# Patient Record
Sex: Female | Born: 1965
Health system: Southern US, Community
[De-identification: ages and names within clinical notes are randomized; demographics above are authoritative.]

## PROBLEM LIST (undated history)

## (undated) DIAGNOSIS — E559 Vitamin D deficiency, unspecified: Secondary | ICD-10-CM

## (undated) DIAGNOSIS — E669 Obesity, unspecified: Secondary | ICD-10-CM

## (undated) DIAGNOSIS — K449 Diaphragmatic hernia without obstruction or gangrene: Secondary | ICD-10-CM

## (undated) DIAGNOSIS — R112 Nausea with vomiting, unspecified: Secondary | ICD-10-CM

## (undated) DIAGNOSIS — Z9889 Other specified postprocedural states: Secondary | ICD-10-CM

## (undated) DIAGNOSIS — M79606 Pain in leg, unspecified: Secondary | ICD-10-CM

## (undated) DIAGNOSIS — F1010 Alcohol abuse, uncomplicated: Secondary | ICD-10-CM

## (undated) DIAGNOSIS — F410 Panic disorder [episodic paroxysmal anxiety] without agoraphobia: Secondary | ICD-10-CM

## (undated) DIAGNOSIS — M069 Rheumatoid arthritis, unspecified: Secondary | ICD-10-CM

## (undated) DIAGNOSIS — E538 Deficiency of other specified B group vitamins: Secondary | ICD-10-CM

## (undated) DIAGNOSIS — G4730 Sleep apnea, unspecified: Secondary | ICD-10-CM

## (undated) DIAGNOSIS — I8390 Asymptomatic varicose veins of unspecified lower extremity: Secondary | ICD-10-CM

## (undated) DIAGNOSIS — F32A Depression, unspecified: Secondary | ICD-10-CM

## (undated) DIAGNOSIS — K219 Gastro-esophageal reflux disease without esophagitis: Secondary | ICD-10-CM

## (undated) HISTORY — PX: ENDOMETRIAL ABLATION W/ NOVASURE: SUR434

## (undated) HISTORY — PX: TUBAL LIGATION: SHX77

## (undated) HISTORY — DX: Deficiency of other specified B group vitamins: E53.8

## (undated) HISTORY — DX: Other specified postprocedural states: Z98.890

## (undated) HISTORY — DX: Diaphragmatic hernia without obstruction or gangrene: K44.9

## (undated) HISTORY — DX: Asymptomatic varicose veins of unspecified lower extremity: I83.90

## (undated) HISTORY — DX: Obesity, unspecified: E66.9

## (undated) HISTORY — DX: Gastro-esophageal reflux disease without esophagitis: K21.9

## (undated) HISTORY — DX: Pain in leg, unspecified: M79.606

## (undated) HISTORY — DX: Vitamin D deficiency, unspecified: E55.9

## (undated) HISTORY — DX: Alcohol abuse, uncomplicated: F10.10

## (undated) HISTORY — DX: Depression, unspecified: F32.A

## (undated) HISTORY — DX: Sleep apnea, unspecified: G47.30

## (undated) HISTORY — DX: Rheumatoid arthritis, unspecified: M06.9

## (undated) HISTORY — DX: Panic disorder (episodic paroxysmal anxiety): F41.0

## (undated) HISTORY — DX: Nausea with vomiting, unspecified: R11.2

---

## 1997-05-20 ENCOUNTER — Inpatient Hospital Stay (HOSPITAL_COMMUNITY): Admission: AD | Admit: 1997-05-20 | Discharge: 1997-05-22 | Payer: Self-pay | Admitting: *Deleted

## 1999-03-15 ENCOUNTER — Inpatient Hospital Stay (HOSPITAL_COMMUNITY): Admission: AD | Admit: 1999-03-15 | Discharge: 1999-03-17 | Payer: Self-pay | Admitting: Obstetrics and Gynecology

## 2001-09-23 ENCOUNTER — Other Ambulatory Visit: Admission: RE | Admit: 2001-09-23 | Discharge: 2001-09-23 | Payer: Self-pay | Admitting: Obstetrics and Gynecology

## 2002-11-09 ENCOUNTER — Other Ambulatory Visit: Admission: RE | Admit: 2002-11-09 | Discharge: 2002-11-09 | Payer: Self-pay | Admitting: Obstetrics and Gynecology

## 2003-06-02 ENCOUNTER — Inpatient Hospital Stay (HOSPITAL_COMMUNITY): Admission: AD | Admit: 2003-06-02 | Discharge: 2003-06-05 | Payer: Self-pay | Admitting: Obstetrics and Gynecology

## 2003-07-15 ENCOUNTER — Other Ambulatory Visit: Admission: RE | Admit: 2003-07-15 | Discharge: 2003-07-15 | Payer: Self-pay | Admitting: Obstetrics and Gynecology

## 2006-02-18 ENCOUNTER — Ambulatory Visit (HOSPITAL_COMMUNITY): Admission: RE | Admit: 2006-02-18 | Discharge: 2006-02-18 | Payer: Self-pay | Admitting: Obstetrics and Gynecology

## 2007-04-15 ENCOUNTER — Ambulatory Visit (HOSPITAL_COMMUNITY): Admission: RE | Admit: 2007-04-15 | Discharge: 2007-04-15 | Payer: Self-pay | Admitting: Obstetrics and Gynecology

## 2007-07-31 ENCOUNTER — Ambulatory Visit (HOSPITAL_COMMUNITY): Admission: RE | Admit: 2007-07-31 | Discharge: 2007-07-31 | Payer: Self-pay | Admitting: *Deleted

## 2007-10-26 ENCOUNTER — Ambulatory Visit (HOSPITAL_COMMUNITY): Admission: RE | Admit: 2007-10-26 | Discharge: 2007-10-26 | Payer: Self-pay | Admitting: *Deleted

## 2008-04-19 ENCOUNTER — Ambulatory Visit (HOSPITAL_COMMUNITY): Admission: RE | Admit: 2008-04-19 | Discharge: 2008-04-19 | Payer: Self-pay | Admitting: Obstetrics and Gynecology

## 2008-07-27 ENCOUNTER — Ambulatory Visit: Payer: Self-pay | Admitting: Vascular Surgery

## 2009-01-27 ENCOUNTER — Ambulatory Visit: Payer: Self-pay | Admitting: Vascular Surgery

## 2009-02-10 ENCOUNTER — Emergency Department (HOSPITAL_BASED_OUTPATIENT_CLINIC_OR_DEPARTMENT_OTHER): Admission: EM | Admit: 2009-02-10 | Discharge: 2009-02-10 | Payer: Self-pay | Admitting: Emergency Medicine

## 2009-02-15 ENCOUNTER — Ambulatory Visit: Payer: Self-pay | Admitting: Vascular Surgery

## 2009-03-15 ENCOUNTER — Ambulatory Visit: Payer: Self-pay | Admitting: Vascular Surgery

## 2009-04-20 ENCOUNTER — Ambulatory Visit (HOSPITAL_COMMUNITY): Admission: RE | Admit: 2009-04-20 | Discharge: 2009-04-20 | Payer: Self-pay | Admitting: Obstetrics and Gynecology

## 2009-06-22 ENCOUNTER — Ambulatory Visit: Payer: Self-pay | Admitting: Internal Medicine

## 2009-07-21 ENCOUNTER — Ambulatory Visit: Payer: Self-pay | Admitting: Internal Medicine

## 2009-09-22 ENCOUNTER — Ambulatory Visit (HOSPITAL_COMMUNITY): Admission: RE | Admit: 2009-09-22 | Discharge: 2009-09-22 | Payer: Self-pay | Admitting: Obstetrics & Gynecology

## 2010-03-16 ENCOUNTER — Ambulatory Visit
Admission: RE | Admit: 2010-03-16 | Discharge: 2010-03-16 | Payer: Self-pay | Source: Home / Self Care | Attending: Internal Medicine | Admitting: Internal Medicine

## 2010-03-29 ENCOUNTER — Other Ambulatory Visit (HOSPITAL_COMMUNITY): Payer: Self-pay | Admitting: Obstetrics and Gynecology

## 2010-03-29 DIAGNOSIS — Z1231 Encounter for screening mammogram for malignant neoplasm of breast: Secondary | ICD-10-CM

## 2010-03-29 DIAGNOSIS — Z1239 Encounter for other screening for malignant neoplasm of breast: Secondary | ICD-10-CM

## 2010-04-23 ENCOUNTER — Ambulatory Visit (HOSPITAL_COMMUNITY)
Admission: RE | Admit: 2010-04-23 | Discharge: 2010-04-23 | Disposition: A | Discharge status: Home / Self Care | Payer: 59 | Source: Ambulatory Visit | Attending: Obstetrics and Gynecology | Admitting: Obstetrics and Gynecology

## 2010-04-23 ENCOUNTER — Ambulatory Visit (HOSPITAL_COMMUNITY): Admission: RE | Admit: 2010-04-23 | Payer: Self-pay | Source: Home / Self Care | Admitting: Obstetrics and Gynecology

## 2010-04-23 DIAGNOSIS — Z1231 Encounter for screening mammogram for malignant neoplasm of breast: Secondary | ICD-10-CM

## 2010-05-26 LAB — GLUCOSE, CAPILLARY
Glucose-Capillary: 63 mg/dL — ABNORMAL LOW (ref 70–99)
Glucose-Capillary: 74 mg/dL (ref 70–99)

## 2010-05-27 LAB — BASIC METABOLIC PANEL
BUN: 7 mg/dL (ref 6–23)
Chloride: 106 mEq/L (ref 96–112)
Creatinine, Ser: 0.83 mg/dL (ref 0.4–1.2)
GFR calc non Af Amer: >60 mL/min (ref >60)

## 2010-05-27 LAB — CBC
MCV: 89 fL (ref 78.0–100.0)
Platelets: 253 10*3/uL (ref 150–400)
RDW: 15 % (ref 11.5–15.5)
WBC: 5.3 10*3/uL (ref 4.0–10.5)

## 2010-06-12 LAB — URINALYSIS, ROUTINE W REFLEX MICROSCOPIC
Glucose, UA: NEGATIVE mg/dL
Ketones, ur: NEGATIVE mg/dL
Leukocytes, UA: NEGATIVE
Protein, ur: NEGATIVE mg/dL

## 2010-06-12 LAB — WET PREP, GENITAL
Trich, Wet Prep: NONE SEEN
Yeast Wet Prep HPF POC: NONE SEEN

## 2010-06-12 LAB — GC/CHLAMYDIA PROBE AMP, GENITAL
Chlamydia, DNA Probe: NEGATIVE
GC Probe Amp, Genital: NEGATIVE

## 2010-07-03 ENCOUNTER — Ambulatory Visit (INDEPENDENT_AMBULATORY_CARE_PROVIDER_SITE_OTHER): Payer: 59 | Admitting: Internal Medicine

## 2010-07-03 DIAGNOSIS — M25569 Pain in unspecified knee: Secondary | ICD-10-CM

## 2010-07-03 DIAGNOSIS — J069 Acute upper respiratory infection, unspecified: Secondary | ICD-10-CM

## 2010-07-24 NOTE — Consult Note (Signed)
VASCULAR SURGERY CONSULTATION   AAMORI, MCMASTERS  DOB:  [birth year]                                       07/27/2008  WJXBJ#:[id number]   The patient presents today for evaluation of bilateral lower extremity  varicosities.  She reports that she has discomfort over the left lateral  thigh and does not have any pain over the varicosities in the right  thigh.  She does not have any history of deep venous thrombosis,  superficial thrombophlebitis or bleeding.   PAST HISTORY:  Negative for hypertension, cancer, diabetes or  cardiovascular or respiratory disease.  She does have a history of  depression in the past.   SOCIAL HISTORY:  She is single.  She does have 5 children.  She does not  smoke.  Does have 1 or 2 drinks per week.   REVIEW OF SYSTEMS:  Her weight is reported at 318 pounds.  She is 5 feet  4 inches tall.  Her review of systems otherwise negative.   PHYSICAL EXAMINATION:  An obese black female in no acute distress.  Her  dorsalis pedis pulses are 2+ bilaterally.  She does have varicosities in  an unusual location in her lateral thighs bilaterally.  She has a few  scattered varicosities in her right medial calf.   She underwent noninvasive vascular lab studies and this does show this  plexus of varicosities extending from her hip bilaterally down to her  torture knee on the lateral aspect of her thigh, this does not appear to  be communicating with saphenous or deep system.  She does not have any  evidence of reflux in her saphenous vein bilaterally.  I discussed this  at length with the patient.  I explained that typically we would  anticipate communication to the superficial varicosities from a  refluxing vein.  We were not able to demonstrate this.  Since she is  having pain over her left lateral thigh, I have recommended that we  start her in compression garments.  Due to her size, she was given a  prescription for panty-style compression garments.   We will see her in 3  months and determine if this is effectively improving her symptoms.  I  then discussed the option of stab phlebectomy of her varicosities to  hopefully improve her symptoms should the conservative treatment fail.  She will see [place] again in 3 months for continued follow up.   Larina Earthly, M.D.  Electronically Signed  TFE/MEDQ  D:  07/27/2008  T:  07/28/2008  Job:  [id number]

## 2010-07-24 NOTE — Procedures (Signed)
LOWER EXTREMITY VENOUS REFLUX EXAM   INDICATION:  Bilateral lower extremity varicose veins.   EXAM:  Using color-flow imaging and pulse Doppler spectral analysis, the  right and left common femoral, superficial femoral, popliteal, posterior  tibial, greater and lesser saphenous veins are evaluated.  There is no  evidence suggesting deep venous insufficiency in the right or left lower  extremity.   The right and left saphenofemoral junctions are competent.  The right  and left GSVs are mildly incompetent proximally.   The right and left proximal short saphenous veins demonstrates  competency.   GSV Diameter (used if found to be incompetent only)                                            Right    Left  Proximal Greater Saphenous Vein           cm       cm  Proximal-to-mid-thigh                     cm       cm  Mid thigh                                 cm       cm  Mid-distal thigh                          cm       cm  Distal thigh                              cm       cm  Knee                                      cm       cm   IMPRESSION:  1. The right and left greater saphenous veins are identified      bilaterally.  2. The right and left greater saphenous veins are not aneurysmal.  3. The right and left greater saphenous veins are not tortuous.  4. The deep venous system is competent bilaterally.  5. The right and left lesser saphenous veins are competent.  6. Varicose veins originate from unknown tortuous superficial veins      bilaterally which course down the lateral aspect of the thigh from      the hip to the knee.  The left greater saphenous vein is not      involved with varicose veins.  The right greater saphenous vein is      involved at the proximal calf where a communicating vessel spans      from the varicose veins on the lateral aspect to the popliteal      level greater saphenous vein.   ___________________________________________  Larina Earthly, M.D.   MC/MEDQ  D:  07/27/2008  T:  07/27/2008  Job:  [id number]

## 2010-07-24 NOTE — Assessment & Plan Note (Signed)
OFFICE VISIT   SOMMER, SPICKARD  DOB:  1965/06/07                                       01/27/2009  JXBJY#:[id number]   The patient presents today for continued followup of her left leg venous  pathology and pain.  She has continued pain over her lateral thigh and  lateral calf related to tributary varicosities.  She had undergone a  formal venous duplex at her last visit showing no evidence of venous  hypertension related to saphenous vein incompetence.  She did have a  tributaries over both lateral right and left thigh, although she says  that she does not have any pain over the right thigh.  She has worn  compression garments and reports this has not given any symptom relief  for her.  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  She  also reports cooking and household chores are difficult with leg pain  and swelling as well.  I have recommend that we undergo stab phlebectomy  of the tributaries over her left lateral thigh and also sclerotherapy  for the reticular veins in her left calf which are also causing pain.  She wishes to proceed with this as soon as we have assured insurance  coverage   Larina Earthly, M.D.  Electronically Signed   TFE/MEDQ  D:  01/27/2009  T:  01/30/2009  Job:  [id number]

## 2010-07-24 NOTE — Assessment & Plan Note (Signed)
OFFICE VISIT   BERONICA, LANSDALE  DOB:  1965-07-10                                       02/15/2009  EAVWU#:[id number]   The patient presents today for treatment of her painful left leg  tributary varicosities.  She under local anesthesia underwent stab  phlebectomy of multiple tributary varicosities over her lateral thigh  and calf and also underwent sclerotherapy of some large telangiectasia  over these areas as well.  She had no immediate complications.  We will  see her back in 3-4 weeks.   Larina Earthly, M.D.  Electronically Signed   TFE/MEDQ  D:  02/15/2009  T:  02/16/2009  Job:  [id number]

## 2010-07-24 NOTE — Assessment & Plan Note (Signed)
OFFICE VISIT   NEWELL, FRATER  DOB:  1965-10-27                                       03/15/2009  YQMVH#:[id number]   The patient presents today for followup of stab phlebectomy of tributary  varicosities over her left thigh and sclerotherapy of several large  spider vein reticular type superficial varices as well.  She did have  some irritation from her stocking in her posterior popliteal space.  Her  incisions have all well-healed and she is resolving the area of the  sclerosing agent.  I am pleased with her result as is the patient and  plan to see her again on an as-needed basis.     Larina Earthly, M.D.  Electronically Signed   TFE/MEDQ  D:  03/15/2009  T:  03/16/2009  Job:  [id number]

## 2010-07-24 NOTE — Op Note (Signed)
NAME:  Emma Gonzales, Emma Gonzales                 ACCOUNT NO.:  [id number]   MEDICAL RECORD NO.:  [id number]          PATIENT TYPE:  AMB   LOCATION:  SDC                           FACILITY:  [facility]   PHYSICIAN:   B. Earlene Plater, M.D.  DATE OF BIRTH:  08-10-1965   DATE OF PROCEDURE:  07/31/2007  DATE OF DISCHARGE:                               OPERATIVE REPORT   PREOPERATIVE DIAGNOSIS:  Desires tubal sterilization.   POSTOPERATIVE DIAGNOSIS:  Desires tubal sterilization.   PROCEDURE:  Essure tubal sterilization.   SURGEON:  Chester Holstein. Earlene Plater, MD   ASSISTANT:  None.   ANESTHESIA:  MAC and 20 mL of 1% Nesacaine paracervical block.   FINDINGS:  Normal endometrial cavity, three coils visible on the right  and four coils on the left post placement.   SPECIMENS:  None.   BLOOD LOSS:  Minimal.   COMPLICATIONS:  None.   FLUID DEFICIT:  Minimal.   INDICATIONS:  The patient desires permanent tubal sterilization.  She is  morbidly obese, therefore, recommend against tubal by laparoscopy.  The  patient advised the risks of surgery including infection, bleeding,  damage to surrounding organs.   PROCEDURE:  The patient was taken to the operating room and MAC  anesthesia obtained.  She was placed in the [place] stirrups, prepped and  draped in the standard fashion.  Bladder drained with in-and-out cath.  Exam under anesthesia showed anteverted normal size uterus, no adnexal  masses.   Speculum inserted, paracervical block placed and single tooth placed on  the anterior lip of the cervix.  The Essure scope was inserted into the  uterine cavity after being flushed.  Good uterine distention obtained.  Both tubal ostia appeared normal.  Right was approached first and the  Essure device inserted to the black depth marker and released in  standard manner.  Three coils visible post placement.  Repeated on the  left in the same fashion, four coils visible after placement on the  left.   The instruments were  removed.  Cervix hemostatic.  The patient tolerated  the procedure well with no complications.  She was taken to the recovery  room, awake, alert and in stable condition.      Gerri Spore B. Earlene Plater, M.D.  Electronically Signed     WBD/MEDQ  D:  07/31/2007  T:  07/31/2007  Job:  [id number]

## 2010-08-22 DIAGNOSIS — Z0000 Encounter for general adult medical examination without abnormal findings: Secondary | ICD-10-CM

## 2010-09-20 ENCOUNTER — Encounter (INDEPENDENT_AMBULATORY_CARE_PROVIDER_SITE_OTHER): Payer: Self-pay | Admitting: General Surgery

## 2010-09-20 ENCOUNTER — Other Ambulatory Visit (INDEPENDENT_AMBULATORY_CARE_PROVIDER_SITE_OTHER): Payer: Self-pay | Admitting: Surgery

## 2010-09-20 ENCOUNTER — Encounter (INDEPENDENT_AMBULATORY_CARE_PROVIDER_SITE_OTHER): Payer: Self-pay | Admitting: Surgery

## 2010-09-20 ENCOUNTER — Ambulatory Visit (INDEPENDENT_AMBULATORY_CARE_PROVIDER_SITE_OTHER): Payer: 59 | Admitting: Surgery

## 2010-09-20 DIAGNOSIS — G4733 Obstructive sleep apnea (adult) (pediatric): Secondary | ICD-10-CM

## 2010-09-20 NOTE — Progress Notes (Signed)
Subjective:     Patient ID: Emma Gonzales, female   DOB: 1966/02/04, 45 y.o.   MRN: [id number]    BP 142/96  Pulse 64  Temp 96.4 F (35.8 C)  Resp 24  Ht 5' 4.5" (1.638 m)  Wt 325 lb 6.4 oz (147.6 kg)  BMI 54.99 kg/m39    HPI45 year old [place] American female comes in today with her mother and young daughter to discuss bariatric surgery. She's had a lifelong history of obesity. She wants to have bariatric surgery and is interested in gastric bypass.  I discussed the procedure in some detail with her and she recognizes he has increased risks and she wants to proceed.   Review of Systems Review of systems is positive for her cholecystectomy and tubal ligation surgery. She denies headaches, pulmonary problems such as asthma, heart problems, ongoing stomach problems or gastrointestinal issues, and no history of DVT. She has had anxiety attacks which he now understands.   Objective:   Physical Exam Obese black female in no acute distress  HEENT: Unremarkable  Neck: Supple no masses  Chest: clear to auscultation  Heart: Sinus rhythm without murmurs or gallops  Abdomen: Soft with well-healed incisions from laparoscopic cholecystectomy.  Extremities: Full range of motion, no cyanosis edema clubbing  Neuro: Alert and oriented x3. Motor and sensory function are grossly intact.  Psych: History of anxiety attacks. Cognitive function good. Affect appropriate.    Assessment:    BMI greater than 50 patient is a good candidate for gastric bypass.     Plan:    Began workup for laparoscopic gastric bypass surgery

## 2010-09-25 ENCOUNTER — Ambulatory Visit (HOSPITAL_COMMUNITY)
Admission: RE | Admit: 2010-09-25 | Discharge: 2010-09-25 | Disposition: A | Discharge status: Home / Self Care | Payer: 59 | Source: Ambulatory Visit | Attending: Surgery | Admitting: Surgery

## 2010-09-25 ENCOUNTER — Other Ambulatory Visit: Payer: Self-pay

## 2010-09-25 DIAGNOSIS — K449 Diaphragmatic hernia without obstruction or gangrene: Secondary | ICD-10-CM | POA: Insufficient documentation

## 2010-09-25 DIAGNOSIS — K219 Gastro-esophageal reflux disease without esophagitis: Secondary | ICD-10-CM | POA: Insufficient documentation

## 2010-09-25 DIAGNOSIS — Z13820 Encounter for screening for osteoporosis: Secondary | ICD-10-CM | POA: Insufficient documentation

## 2010-09-25 DIAGNOSIS — Z6841 Body Mass Index (BMI) 40.0 and over, adult: Secondary | ICD-10-CM | POA: Insufficient documentation

## 2010-10-17 ENCOUNTER — Encounter: Payer: Self-pay | Admitting: *Deleted

## 2010-10-17 ENCOUNTER — Encounter: Payer: 59 | Attending: Surgery | Admitting: *Deleted

## 2010-10-17 DIAGNOSIS — E669 Obesity, unspecified: Secondary | ICD-10-CM | POA: Insufficient documentation

## 2010-10-17 DIAGNOSIS — Z01818 Encounter for other preprocedural examination: Secondary | ICD-10-CM | POA: Insufficient documentation

## 2010-10-17 DIAGNOSIS — Z713 Dietary counseling and surveillance: Secondary | ICD-10-CM | POA: Insufficient documentation

## 2010-10-17 NOTE — Progress Notes (Signed)
  Patient was seen on 10/17/2010 for Pre-Operative Gastric Bypass Nutrition Assessment. Assessment and letter of approval faxed to [facility] coordinator on 10/17/2010.    Patient to call for Pre-Op and Post-Op Nutrition Education at [facility] when surgery is scheduled.

## 2010-10-24 ENCOUNTER — Other Ambulatory Visit (INDEPENDENT_AMBULATORY_CARE_PROVIDER_SITE_OTHER): Payer: Self-pay | Admitting: Surgery

## 2010-10-24 LAB — CBC WITH DIFFERENTIAL/PLATELET
Basophils Absolute: 0 10*3/uL (ref 0.0–0.1)
Eosinophils Relative: 2 % (ref 0–5)
HCT: 41.9 % (ref 36.0–46.0)
Hemoglobin: 13.6 g/dL (ref 12.0–15.0)
Lymphocytes Relative: 45 % (ref 12–46)
Lymphs Abs: 2.3 10*3/uL (ref 0.7–4.0)
MCV: 88.8 fL (ref 78.0–100.0)
Monocytes Absolute: 0.5 10*3/uL (ref 0.1–1.0)
Monocytes Relative: 10 % (ref 3–12)
Neutro Abs: 2.3 10*3/uL (ref 1.7–7.7)
RBC: 4.72 MIL/uL (ref 3.87–5.11)
WBC: 5.2 10*3/uL (ref 4.0–10.5)

## 2010-10-24 LAB — COMPREHENSIVE METABOLIC PANEL
AST: 13 U/L (ref 0–37)
Albumin: 3.6 g/dL (ref 3.5–5.2)
BUN: 11 mg/dL (ref 6–23)
CO2: 27 mEq/L (ref 19–32)
Calcium: 8.8 mg/dL (ref 8.4–10.5)
Chloride: 108 mEq/L (ref 96–112)
Glucose, Bld: 85 mg/dL (ref 70–99)
Potassium: 4.4 mEq/L (ref 3.5–5.3)

## 2010-10-24 LAB — T4: T4, Total: 5.8 ug/dL (ref 5.0–12.5)

## 2010-10-24 LAB — LIPID PANEL
Cholesterol: 166 mg/dL (ref 0–200)
HDL: 58 mg/dL (ref >39)

## 2010-10-24 LAB — HEMOGLOBIN A1C: Mean Plasma Glucose: 137 mg/dL — ABNORMAL HIGH (ref <117)

## 2010-10-25 LAB — H. PYLORI ANTIBODY, IGG: H Pylori IgG: 0.42 {ISR}

## 2010-11-22 ENCOUNTER — Ambulatory Visit: Payer: 59 | Admitting: Internal Medicine

## 2010-12-05 LAB — CBC
Hemoglobin: 13.1
Platelets: 247
RDW: 13.7
WBC: 7

## 2010-12-05 LAB — PREGNANCY, URINE: Preg Test, Ur: NEGATIVE

## 2010-12-05 LAB — DIFFERENTIAL
Basophils Absolute: 0.1
Lymphocytes Relative: 34
Monocytes Absolute: 0.4
Neutro Abs: 4

## 2011-01-10 ENCOUNTER — Encounter: Payer: Self-pay | Admitting: *Deleted

## 2011-01-10 ENCOUNTER — Encounter: Payer: 59 | Attending: Surgery | Admitting: *Deleted

## 2011-01-10 DIAGNOSIS — Z713 Dietary counseling and surveillance: Secondary | ICD-10-CM | POA: Insufficient documentation

## 2011-01-10 DIAGNOSIS — E669 Obesity, unspecified: Secondary | ICD-10-CM

## 2011-01-10 DIAGNOSIS — Z01818 Encounter for other preprocedural examination: Secondary | ICD-10-CM | POA: Insufficient documentation

## 2011-01-10 NOTE — Patient Instructions (Signed)
Follow:    Pre-Op Diet per MD 2 weeks prior to surgery  Phase 2- Liquids (clear/full) 2 weeks after surgery  Vitamin/Mineral/Calcium guidelines for purchasing bariatric supplements  Exercise guidelines pre and post-op per MD  Follow-up at [facility] in 2 weeks post-op for diet advancement. Contact Kamarie Palma as needed with questions/concerns.  

## 2011-01-10 NOTE — Progress Notes (Signed)
  Bariatric Class:  Appt start time: 1700 end time:  1800.  Pre-Operative Nutrition Class  Patient was seen on 01/10/2011 for Pre-Operative Nutrition education at the [facility].   Surgery date: 01/28/11 Surgery type: Gastric Bypass  Weight today: 318.7lbs  Weight change: 2.2 lbs lost Total weight lost: 2.2 lbs BMI: 54  Samples given per MNT protocol: Bariatric Advantage Multivitamin Lot # [id number] Exp: 9/13  Bariatric Advantage Calcium Citrate Lot # [id number] Exp: 9/13  Celebrate Vitamins Multivitamin Lot # [id number] Exp: 6/14  Celebrate VitaminsCalcium Citrate Lot # [id number] Exp: 9/13  Unjury Protein Lot# [id number] Exp: 1/14 The following the learning objective met by the patient during this course:   Identifies Pre-Op Dietary Goals and will begin 2 weeks pre-operatively   Identifies appropriate sources of fluids and proteins   States protein recommendations and appropriate sources pre and post-operatively  Identifies Post-Operative Dietary Goals and will follow for 2 weeks post-operatively  Identifies appropriate multivitamin and calcium sources  Describes the need for physical activity post-operatively and will follow MD recommendations  States when to call healthcare provider regarding medication questions or post-operative complications  Handouts given during class include:  Pre-Op Bariatric Surgery Diet Handout  Protein Shake Handout  Post-Op Bariatric Surgery Nutrition Handout  BELT Program Information Flyer  Support Group Information Flyer  Follow-Up Plan: Patient will follow-up at [facility] 2 weeks post operatively for diet advancement per MD.

## 2011-01-17 ENCOUNTER — Encounter (HOSPITAL_COMMUNITY): Payer: Self-pay | Admitting: Pharmacy Technician

## 2011-01-23 ENCOUNTER — Other Ambulatory Visit (INDEPENDENT_AMBULATORY_CARE_PROVIDER_SITE_OTHER): Payer: Self-pay | Admitting: General Surgery

## 2011-01-24 ENCOUNTER — Encounter (HOSPITAL_COMMUNITY): Payer: Self-pay

## 2011-01-24 ENCOUNTER — Ambulatory Visit (INDEPENDENT_AMBULATORY_CARE_PROVIDER_SITE_OTHER): Payer: 59 | Admitting: Surgery

## 2011-01-24 ENCOUNTER — Encounter (INDEPENDENT_AMBULATORY_CARE_PROVIDER_SITE_OTHER): Payer: Self-pay | Admitting: Surgery

## 2011-01-24 ENCOUNTER — Encounter (HOSPITAL_COMMUNITY): Payer: 59

## 2011-01-24 VITALS — BP 122/84 | HR 62 | Temp 97.1°F | Resp 16 | Ht 64.5 in | Wt 312.4 lb

## 2011-01-24 DIAGNOSIS — G4730 Sleep apnea, unspecified: Secondary | ICD-10-CM

## 2011-01-24 DIAGNOSIS — K449 Diaphragmatic hernia without obstruction or gangrene: Secondary | ICD-10-CM

## 2011-01-24 DIAGNOSIS — R112 Nausea with vomiting, unspecified: Secondary | ICD-10-CM

## 2011-01-24 DIAGNOSIS — E669 Obesity, unspecified: Secondary | ICD-10-CM

## 2011-01-24 DIAGNOSIS — K219 Gastro-esophageal reflux disease without esophagitis: Secondary | ICD-10-CM

## 2011-01-24 DIAGNOSIS — F410 Panic disorder [episodic paroxysmal anxiety] without agoraphobia: Secondary | ICD-10-CM

## 2011-01-24 DIAGNOSIS — Z9889 Other specified postprocedural states: Secondary | ICD-10-CM

## 2011-01-24 HISTORY — DX: Panic disorder (episodic paroxysmal anxiety): F41.0

## 2011-01-24 HISTORY — DX: Nausea with vomiting, unspecified: R11.2

## 2011-01-24 HISTORY — PX: CHOLECYSTECTOMY: SHX55

## 2011-01-24 HISTORY — PX: FOOT SURGERY: SHX648

## 2011-01-24 HISTORY — DX: Gastro-esophageal reflux disease without esophagitis: K21.9

## 2011-01-24 HISTORY — DX: Other specified postprocedural states: Z98.890

## 2011-01-24 HISTORY — DX: Diaphragmatic hernia without obstruction or gangrene: K44.9

## 2011-01-24 HISTORY — DX: Sleep apnea, unspecified: G47.30

## 2011-01-24 LAB — CBC
HCT: 40 % (ref 36.0–46.0)
MCH: 29.4 pg (ref 26.0–34.0)
MCHC: 32.8 g/dL (ref 30.0–36.0)
MCV: 89.7 fL (ref 78.0–100.0)
Platelets: 280 10*3/uL (ref 150–400)
RDW: 13.5 % (ref 11.5–15.5)

## 2011-01-24 LAB — COMPREHENSIVE METABOLIC PANEL
AST: 14 U/L (ref 0–37)
Albumin: 3.6 g/dL (ref 3.5–5.2)
BUN: 11 mg/dL (ref 6–23)
Calcium: 9.3 mg/dL (ref 8.4–10.5)
Chloride: 106 mEq/L (ref 96–112)
Creatinine, Ser: 0.9 mg/dL (ref 0.50–1.10)
GFR calc non Af Amer: 77 mL/min — ABNORMAL LOW (ref >90)
Total Bilirubin: 0.2 mg/dL — ABNORMAL LOW (ref 0.3–1.2)

## 2011-01-24 LAB — DIFFERENTIAL
Basophils Absolute: 0 10*3/uL (ref 0.0–0.1)
Basophils Relative: 0 % (ref 0–1)
Lymphocytes Relative: 36 % (ref 12–46)
Monocytes Absolute: 0.6 10*3/uL (ref 0.1–1.0)
Monocytes Relative: 9 % (ref 3–12)
Neutro Abs: 3.9 10*3/uL (ref 1.7–7.7)
Neutrophils Relative %: 55 % (ref 43–77)

## 2011-01-24 NOTE — Patient Instructions (Addendum)
20 CLAUDEAN LEAVELLE  01/24/2011   Your procedure is scheduled on: 01-28-11 Report to [facility] at 09:30 AM.  Call this number if you have problems the morning of surgery: [phone]   Remember:   Do not eat food:After Midnight.  Do not drink clear liquids: After Midnight.  Take these medicines the morning of surgery with A SIP OF WATER:Celexa   Do not wear jewelry, make-up or nail polish.  Do not wear lotions, powders, or perfumes. You may wear deodorant.  Do not shave 48 hours prior to surgery.  Do not bring valuables to the hospital.  Contacts, dentures or bridgework may not be worn into surgery.  Leave suitcase in the car. After surgery it may be brought to your room.  For patients admitted to the hospital, checkout time is 11:00 AM the day of discharge.   Patients discharged the day of surgery will not be allowed to drive home.  Name and phone number of your driver: family-sister Thurma Priego [phone]  Special Instructions: CHG Shower Use Special Wash: 1/2 bottle night before surgery and 1/2 bottle morning of surgery.   Please read over the following fact sheets that you were given: MRSA Information, Incentive Spirometry instruction sheet

## 2011-01-24 NOTE — Patient Instructions (Signed)
Stay on preop low carb diet

## 2011-01-24 NOTE — Pre-Procedure Instructions (Signed)
  CXR(09-25-10) reprt with chart.( 01-24-11 EKG(09-25-10)

## 2011-01-24 NOTE — Progress Notes (Signed)
Subjective:     Patient ID: Emma Gonzales, female   DOB: 12-Apr-1965, 45 y.o.   MRN: [id number]    BP 122/84  Pulse 62  Temp(Src) 97.1 F (36.2 C) (Temporal)  Resp 16  Ht 5' 4.5" (1.638 m)  Wt 312 lb 6.4 oz (141.704 kg)  BMI 52.80 kg/m3    HPI38 year old [place] American female comes in today with her mother and young daughter to discuss bariatric surgery. She's had a lifelong history of obesity. She wants to have bariatric surgery and is interested in gastric bypass.  I discussed the procedure in some detail with her and she recognizes he has increased risks and she wants to proceed. Informed consent has been obtained.  The only preop lab that was abnormal was an elevated HgA1C of 6.4.  A small sliding hiatal hernia with GER noted.  Encouraged to stay on the preop diet  Her primary is Emma Gonzales.     Review of Systems Review of systems is positive for her cholecystectomy and tubal ligation surgery. She denies headaches, pulmonary problems such as asthma, heart problems, ongoing stomach problems or gastrointestinal issues, and no history of DVT. She has had anxiety attacks which he now understands.   Objective:   Physical Exam Obese black female in no acute distress  HEENT: Unremarkable  Neck: Supple no masses  Chest: clear to auscultation  Heart: Sinus rhythm without murmurs or gallops  Abdomen: Soft with well-healed incisions from laparoscopic cholecystectomy.  Extremities: Full range of motion, no cyanosis edema clubbing.  No history of DVT  Neuro: Alert and oriented x3. Motor and sensory function are grossly intact.  Psych: History of anxiety attacks. Cognitive function good. Affect appropriate.    Assessment:   BMI 52 with prediabetes, OSA     Plan:    Laparoscopic Roux en Y Gastric Bypass  [name], MD, [facility] [phone] beeper [phone]  01/24/2011 10:24 AM

## 2011-01-24 NOTE — Pre-Procedure Instructions (Signed)
01-24-11 Pt. Reminded  Of bowel prep instructions per office.

## 2011-01-27 MED ORDER — DEXTROSE 5 % IV SOLN
2.0000 g | Freq: Once | INTRAVENOUS | Status: AC
  Administered 2011-01-28: 2 g via INTRAVENOUS
  Filled 2011-01-27: qty 2

## 2011-01-27 MED ORDER — HEPARIN SODIUM (PORCINE) 5000 UNIT/ML IJ SOLN
5000.0000 [IU] | Freq: Once | INTRAMUSCULAR | Status: AC
  Administered 2011-01-28: 5000 [IU] via SUBCUTANEOUS

## 2011-01-28 ENCOUNTER — Inpatient Hospital Stay (HOSPITAL_COMMUNITY): Payer: 59 | Admitting: Certified Registered Nurse Anesthetist

## 2011-01-28 ENCOUNTER — Encounter (HOSPITAL_COMMUNITY): Payer: Self-pay | Admitting: Certified Registered Nurse Anesthetist

## 2011-01-28 ENCOUNTER — Encounter (HOSPITAL_COMMUNITY): Payer: Self-pay | Admitting: *Deleted

## 2011-01-28 ENCOUNTER — Inpatient Hospital Stay (HOSPITAL_COMMUNITY)
Admission: RE | Admit: 2011-01-28 | Discharge: 2011-01-30 | DRG: 621 | Disposition: A | Discharge status: Home / Self Care | Payer: 59 | Source: Ambulatory Visit | Attending: Surgery | Admitting: Surgery

## 2011-01-28 ENCOUNTER — Encounter (HOSPITAL_COMMUNITY)
Admission: RE | Disposition: A | Discharge status: Home / Self Care | Payer: Self-pay | Source: Ambulatory Visit | Attending: Surgery

## 2011-01-28 DIAGNOSIS — Z6841 Body Mass Index (BMI) 40.0 and over, adult: Secondary | ICD-10-CM

## 2011-01-28 DIAGNOSIS — K449 Diaphragmatic hernia without obstruction or gangrene: Secondary | ICD-10-CM

## 2011-01-28 DIAGNOSIS — G4733 Obstructive sleep apnea (adult) (pediatric): Secondary | ICD-10-CM | POA: Diagnosis present

## 2011-01-28 DIAGNOSIS — F411 Generalized anxiety disorder: Secondary | ICD-10-CM | POA: Diagnosis present

## 2011-01-28 DIAGNOSIS — Z9089 Acquired absence of other organs: Secondary | ICD-10-CM

## 2011-01-28 DIAGNOSIS — R7309 Other abnormal glucose: Secondary | ICD-10-CM | POA: Diagnosis present

## 2011-01-28 DIAGNOSIS — Z01812 Encounter for preprocedural laboratory examination: Secondary | ICD-10-CM

## 2011-01-28 DIAGNOSIS — E669 Obesity, unspecified: Secondary | ICD-10-CM

## 2011-01-28 HISTORY — PX: GASTRIC ROUX-EN-Y: SHX5262

## 2011-01-28 LAB — CBC
Hemoglobin: 13.4 g/dL (ref 12.0–15.0)
MCH: 29.1 pg (ref 26.0–34.0)
RBC: 4.6 MIL/uL (ref 3.87–5.11)

## 2011-01-28 LAB — CREATININE, SERUM
Creatinine, Ser: 0.75 mg/dL (ref 0.50–1.10)
GFR calc Af Amer: >90 mL/min (ref >90)
GFR calc non Af Amer: >90 mL/min (ref >90)

## 2011-01-28 SURGERY — LAPAROSCOPIC ROUX-EN-Y GASTRIC
Anesthesia: General | Site: Abdomen | Wound class: Clean

## 2011-01-28 MED ORDER — LACTATED RINGERS IR SOLN
Status: DC | PRN
  Administered 2011-01-28: 3000 mL

## 2011-01-28 MED ORDER — FENTANYL CITRATE 0.05 MG/ML IJ SOLN
INTRAMUSCULAR | Status: DC | PRN
  Administered 2011-01-28 (×2): 50 ug via INTRAVENOUS
  Administered 2011-01-28: 100 ug via INTRAVENOUS
  Administered 2011-01-28: 50 ug via INTRAVENOUS
  Administered 2011-01-28: 100 ug via INTRAVENOUS
  Administered 2011-01-28 (×2): 50 ug via INTRAVENOUS

## 2011-01-28 MED ORDER — ONDANSETRON HCL 4 MG/2ML IJ SOLN
INTRAMUSCULAR | Status: AC
  Administered 2011-01-28: 4 mg via INTRAVENOUS
  Filled 2011-01-28: qty 2

## 2011-01-28 MED ORDER — KCL IN DEXTROSE-NACL 20-5-0.45 MEQ/L-%-% IV SOLN
INTRAVENOUS | Status: DC
  Administered 2011-01-28 – 2011-01-30 (×5): via INTRAVENOUS
  Filled 2011-01-28 (×8): qty 1000

## 2011-01-28 MED ORDER — UNJURY CHOCOLATE CLASSIC POWDER: 2.0000 [oz_av] | Freq: Four times a day (QID) | ORAL | Status: DC

## 2011-01-28 MED ORDER — ONDANSETRON HCL 4 MG/2ML IJ SOLN
INTRAMUSCULAR | Status: DC | PRN
  Administered 2011-01-28: 4 mg via INTRAVENOUS

## 2011-01-28 MED ORDER — FENTANYL CITRATE 0.05 MG/ML IJ SOLN
INTRAMUSCULAR | Status: AC
  Administered 2011-01-28: 25 ug via INTRAVENOUS
  Filled 2011-01-28: qty 2

## 2011-01-28 MED ORDER — FENTANYL CITRATE 0.05 MG/ML IJ SOLN
25.0000 ug | INTRAMUSCULAR | Status: DC | PRN
  Administered 2011-01-28: 25 ug via INTRAVENOUS

## 2011-01-28 MED ORDER — CISATRACURIUM BESYLATE 2 MG/ML IV SOLN
INTRAVENOUS | Status: DC | PRN
  Administered 2011-01-28: 2 mg via INTRAVENOUS
  Administered 2011-01-28: 10 mg via INTRAVENOUS
  Administered 2011-01-28: 4 mg via INTRAVENOUS

## 2011-01-28 MED ORDER — UNJURY VANILLA POWDER: 2.0000 [oz_av] | Freq: Four times a day (QID) | ORAL | Status: DC

## 2011-01-28 MED ORDER — MIDAZOLAM HCL 5 MG/5ML IJ SOLN
INTRAMUSCULAR | Status: DC | PRN
  Administered 2011-01-28: 2 mg via INTRAVENOUS

## 2011-01-28 MED ORDER — OXYCODONE-ACETAMINOPHEN 5-325 MG/5ML PO SOLN: 5.0000 mL | ORAL | Status: DC | PRN

## 2011-01-28 MED ORDER — TISSEEL VH 10 ML EX KIT
PACK | CUTANEOUS | Status: DC | PRN
  Administered 2011-01-28: 10 mL

## 2011-01-28 MED ORDER — ONDANSETRON HCL 4 MG/2ML IJ SOLN
4.0000 mg | INTRAMUSCULAR | Status: DC | PRN
  Administered 2011-01-28: 4 mg via INTRAVENOUS

## 2011-01-28 MED ORDER — NEOSTIGMINE METHYLSULFATE 1 MG/ML IJ SOLN
INTRAMUSCULAR | Status: DC | PRN
  Administered 2011-01-28: 5 mg via INTRAVENOUS

## 2011-01-28 MED ORDER — UNJURY CHICKEN SOUP POWDER: 2.0000 [oz_av] | Freq: Four times a day (QID) | ORAL | Status: DC

## 2011-01-28 MED ORDER — PROMETHAZINE HCL 25 MG/ML IJ SOLN: 6.2500 mg | INTRAMUSCULAR | Status: DC | PRN

## 2011-01-28 MED ORDER — LACTATED RINGERS IV SOLN
INTRAVENOUS | Status: DC | PRN
  Administered 2011-01-28 (×3): via INTRAVENOUS

## 2011-01-28 MED ORDER — ACETAMINOPHEN 10 MG/ML IV SOLN
INTRAVENOUS | Status: DC | PRN
  Administered 2011-01-28: 1000 mg via INTRAVENOUS

## 2011-01-28 MED ORDER — ACETAMINOPHEN 160 MG/5ML PO SOLN
650.0000 mg | ORAL | Status: DC | PRN
  Administered 2011-01-30: 650 mg via ORAL
  Filled 2011-01-28: qty 20.3

## 2011-01-28 MED ORDER — LABETALOL HCL 5 MG/ML IV SOLN
INTRAVENOUS | Status: DC | PRN
  Administered 2011-01-28 (×4): 5 mg via INTRAVENOUS

## 2011-01-28 MED ORDER — SUCCINYLCHOLINE CHLORIDE 20 MG/ML IJ SOLN
INTRAMUSCULAR | Status: DC | PRN
  Administered 2011-01-28: 100 mg via INTRAVENOUS

## 2011-01-28 MED ORDER — SODIUM CHLORIDE 0.9 % IR SOLN
Status: DC | PRN
  Administered 2011-01-28: 1000 mL

## 2011-01-28 MED ORDER — PROPOFOL 10 MG/ML IV EMUL
INTRAVENOUS | Status: DC | PRN
  Administered 2011-01-28: 200 mg via INTRAVENOUS

## 2011-01-28 MED ORDER — HEPARIN SODIUM (PORCINE) 5000 UNIT/ML IJ SOLN
5000.0000 [IU] | Freq: Three times a day (TID) | INTRAMUSCULAR | Status: DC
  Administered 2011-01-29 – 2011-01-30 (×5): 5000 [IU] via SUBCUTANEOUS
  Filled 2011-01-28 (×9): qty 1

## 2011-01-28 MED ORDER — BUPIVACAINE LIPOSOME 1.3 % IJ SUSP
20.0000 mL | INTRAMUSCULAR | Status: AC
  Administered 2011-01-28: 20 mL
  Filled 2011-01-28: qty 20

## 2011-01-28 MED ORDER — CEFOXITIN SODIUM-DEXTROSE 1-4 GM-% IV SOLR (PREMIX)
INTRAVENOUS | Status: AC
  Filled 2011-01-28: qty 100

## 2011-01-28 MED ORDER — GLYCOPYRROLATE 0.2 MG/ML IJ SOLN
INTRAMUSCULAR | Status: DC | PRN
  Administered 2011-01-28: .6 mg via INTRAVENOUS

## 2011-01-28 MED ORDER — MORPHINE SULFATE 2 MG/ML IJ SOLN: 2.0000 mg | INTRAMUSCULAR | Status: DC | PRN

## 2011-01-28 MED ORDER — ACETAMINOPHEN 10 MG/ML IV SOLN
INTRAVENOUS | Status: AC
  Filled 2011-01-28: qty 100

## 2011-01-28 SURGICAL SUPPLY — 68 items
APL SKNCLS STERI-STRIP NONHPOA (GAUZE/BANDAGES/DRESSINGS) ×1
APL SRG 32X5 SNPLK LF DISP (MISCELLANEOUS) ×1
APPLICATOR COTTON TIP 6IN STRL (MISCELLANEOUS) IMPLANT
APPLIER CLIP ROT 13.4 12 LRG (CLIP) ×2
APR CLP LRG 13.4X12 ROT 20 MLT (CLIP) ×1
BENZOIN TINCTURE PRP APPL 2/3 (GAUZE/BANDAGES/DRESSINGS) ×2 IMPLANT
BLADE SURG 15 STRL LF DISP TIS (BLADE) ×1 IMPLANT
BLADE SURG 15 STRL SS (BLADE) ×1
CABLE HIGH FREQUENCY MONO STRZ (ELECTRODE) IMPLANT
CANISTER SUCTION 2500CC (MISCELLANEOUS) ×2 IMPLANT
CLIP APPLIE ROT 13.4 12 LRG (CLIP) ×1 IMPLANT
CLIP SUT LAPRA TY ABSORB (SUTURE) ×4 IMPLANT
CLOTH BEACON ORANGE TIMEOUT ST (SAFETY) ×2 IMPLANT
COVER SURGICAL LIGHT HANDLE (MISCELLANEOUS) ×2 IMPLANT
DEVICE SUT QUICK LOAD TK 5 (STAPLE) ×2 IMPLANT
DEVICE SUT TI-KNOT TK 5X26 (MISCELLANEOUS) ×2 IMPLANT
DEVICE SUTURE ENDOST 10MM (ENDOMECHANICALS) ×2 IMPLANT
DISSECTOR BLUNT TIP ENDO 5MM (MISCELLANEOUS) ×2 IMPLANT
DRAIN PENROSE 18X1/4 LTX STRL (WOUND CARE) ×2 IMPLANT
DRAPE CAMERA CLOSED 9X96 (DRAPES) ×2 IMPLANT
ENDOSTITCH 0 SINGLE 48 (SUTURE) ×2 IMPLANT
GAUZE SPONGE 4X4 16PLY XRAY LF (GAUZE/BANDAGES/DRESSINGS) ×2 IMPLANT
GLOVE BIOGEL M 8.0 STRL (GLOVE) ×2 IMPLANT
GOWN STRL NON-REIN LRG LVL3 (GOWN DISPOSABLE) ×2 IMPLANT
GOWN STRL REIN XL XLG (GOWN DISPOSABLE) ×4 IMPLANT
HANDLE STAPLE EGIA 4 XL (STAPLE) ×2 IMPLANT
HOVERMATT SINGLE USE (MISCELLANEOUS) ×2 IMPLANT
KIT BASIN OR (CUSTOM PROCEDURE TRAY) ×2 IMPLANT
KIT GASTRIC LAVAGE 34FR ADT (SET/KITS/TRAYS/PACK) ×2 IMPLANT
NEEDLE SPNL 22GX3.5 QUINCKE BK (NEEDLE) ×2 IMPLANT
NS IRRIG 1000ML POUR BTL (IV SOLUTION) ×2 IMPLANT
PACK CARDIOVASCULAR III (CUSTOM PROCEDURE TRAY) ×2 IMPLANT
PEN SKIN MARKING BROAD (MISCELLANEOUS) ×2 IMPLANT
RELOAD EGIA 45 MED/THCK PURPLE (STAPLE) ×2 IMPLANT
RELOAD EGIA 45 TAN VASC (STAPLE) ×2 IMPLANT
RELOAD EGIA 60 MED/THCK PURPLE (STAPLE) ×4 IMPLANT
RELOAD EGIA 60 TAN VASC (STAPLE) ×2 IMPLANT
RELOAD ENDO STITCH 2.0 (ENDOMECHANICALS) ×18
SCISSORS LAP 5X35 DISP (ENDOMECHANICALS) ×2 IMPLANT
SEALANT SURGICAL APPL DUAL CAN (MISCELLANEOUS) ×2 IMPLANT
SET IRRIG TUBING LAPAROSCOPIC (IRRIGATION / IRRIGATOR) ×2 IMPLANT
SHEARS CURVED HARMONIC AC 45CM (MISCELLANEOUS) ×2 IMPLANT
SLEEVE ADV FIXATION 12X100MM (TROCAR) IMPLANT
SLEEVE ADV FIXATION 5X100MM (TROCAR) IMPLANT
SLEEVE Z-THREAD 12X100MM (TROCAR) IMPLANT
SLEEVE Z-THREAD 5X100MM (TROCAR) IMPLANT
SOLUTION ANTI FOG 6CC (MISCELLANEOUS) ×2 IMPLANT
SPONGE GAUZE 4X4 12PLY (GAUZE/BANDAGES/DRESSINGS) ×2 IMPLANT
STAPLER VISISTAT 35W (STAPLE) ×2 IMPLANT
STRIP CLOSURE SKIN 1/2X4 (GAUZE/BANDAGES/DRESSINGS) ×2 IMPLANT
SUT RELOAD ENDO STITCH 2 48X1 (ENDOMECHANICALS) ×5
SUT RELOAD ENDO STITCH 2.0 (ENDOMECHANICALS) ×4
SUT VIC AB 2-0 SH 27 (SUTURE) ×1
SUT VIC AB 2-0 SH 27X BRD (SUTURE) ×1 IMPLANT
SUT VIC AB 4-0 SH 18 (SUTURE) ×2 IMPLANT
SUTURE RELOAD END STTCH 2 48X1 (ENDOMECHANICALS) ×5 IMPLANT
SUTURE RELOAD ENDO STITCH 2.0 (ENDOMECHANICALS) ×4 IMPLANT
SYR 20CC LL (SYRINGE) ×2 IMPLANT
SYR 30ML LL (SYRINGE) ×2 IMPLANT
SYR 50ML LL SCALE MARK (SYRINGE) ×2 IMPLANT
TRAY FOLEY CATH 14FRSI W/METER (CATHETERS) ×2 IMPLANT
TROCAR ADV FIXATION 12X100MM (TROCAR) ×6 IMPLANT
TROCAR XCEL 12X100 BLDLESS (ENDOMECHANICALS) ×2 IMPLANT
TROCAR Z-THREAD FIOS 12X100MM (TROCAR) IMPLANT
TROCAR Z-THREAD FIOS 5X100MM (TROCAR) ×2 IMPLANT
TUBING CONNECTING 10 (TUBING) ×2 IMPLANT
TUBING ENDO SMARTCAP (MISCELLANEOUS) ×2 IMPLANT
TUBING FILTER THERMOFLATOR (ELECTROSURGICAL) ×2 IMPLANT

## 2011-01-28 NOTE — H&P (Signed)
Patient ID: Emma Gonzales, female DOB: 1965-05-16, 45 y.o. MRN: [id number]  BP 122/84  Pulse 62  Temp(Src) 97.1 F (36.2 C) (Temporal)  Resp 16  Ht 5' 4.5" (1.638 m)  Wt 312 lb 6.4 oz (141.704 kg)  BMI 52.80 kg/m79  HPI45 year old [place] American female comes in today with her mother and young daughter to discuss bariatric surgery. She's had a lifelong history of obesity. She wants to have bariatric surgery and is interested in gastric bypass. I discussed the procedure in some detail with her and she recognizes he has increased risks and she wants to proceed. Informed consent has been obtained. The only preop lab that was abnormal was an elevated HgA1C of 6.4. A small sliding hiatal hernia with GER noted. Encouraged to stay on the preop diet Her primary is Emma Gonzales.  Review of Systems  Review of systems is positive for her cholecystectomy and tubal ligation surgery. She denies headaches, pulmonary problems such as asthma, heart problems, ongoing stomach problems or gastrointestinal issues, and no history of DVT. She has had anxiety attacks which he now understands.  Objective:   Physical Exam  Obese black female in no acute distress  HEENT: Unremarkable  Neck: Supple no masses  Chest: clear to auscultation  Heart: Sinus rhythm without murmurs or gallops  Abdomen: Soft with well-healed incisions from laparoscopic cholecystectomy.  Extremities: Full range of motion, no cyanosis edema clubbing. No history of DVT  Neuro: Alert and oriented x3. Motor and sensory function are grossly intact.  Psych: History of anxiety attacks. Cognitive function good. Affect appropriate.   Assessment:   BMI 52 with prediabetes, OSA   Plan:   Laparoscopic Roux en Y Gastric Bypass  Emma B. Daphine Deutscher, MD, [facility]  [phone] beeper  [phone] There has been no change in the patient's past medical history or physical exam in the past 24 hours to the best of my knowledge.   Expectations and outcome results have been discussed with the patient to include risks and benefits.  All questions have been answered and will proceed with previously discussed procedure noted and signed in the consent form in the patient's record.    Emma Gonzales BMD @NOW  01/28/2011

## 2011-01-28 NOTE — Transfer of Care (Signed)
Immediate Anesthesia Transfer of Care Note  Patient: Emma Gonzales  Procedure(s) Performed:  LAPAROSCOPIC ROUX-EN-Y GASTRIC - With upper endoscopy  Patient Location: PACU  Anesthesia Type: General  Level of Consciousness: awake, patient cooperative and responds to stimulation  Airway & Oxygen Therapy: Patient Spontanous Breathing and Patient connected to face mask oxygen  Post-op Assessment: Report given to PACU RN, Post -op Vital signs reviewed and stable and Patient moving all extremities  Post vital signs: Reviewed and stable  Complications: No apparent anesthesia complications

## 2011-01-28 NOTE — Progress Notes (Signed)
Completed one day bowel prep 01-27-11,clear liquids 01-27-11

## 2011-01-28 NOTE — Anesthesia Postprocedure Evaluation (Signed)
  Anesthesia Post-op Note  Patient: Emma Gonzales  Procedure(s) Performed:  LAPAROSCOPIC ROUX-EN-Y GASTRIC - With upper endoscopy  Patient Location: PACU  Anesthesia Type: General  Level of Consciousness: awake and alert   Airway and Oxygen Therapy: Patient Spontanous Breathing  Post-op Pain: mild  Post-op Assessment: Post-op Vital signs reviewed, Patient's Cardiovascular Status Stable, Respiratory Function Stable, Patent Airway and No signs of Nausea or vomiting  Post-op Vital Signs: stable  Complications: No apparent anesthesia complications

## 2011-01-28 NOTE — Op Note (Signed)
Surgeon: Pollyann Savoy. Daphine Deutscher, MD, FACS Asst:  Lodema Pilot Anesthesia: General endotracheal Drains: None  Procedure: Laparoscopic Roux en Y gastric bypass with 40 cm BP limb and 100 cm Roux limb, antecolic, antegastric, candy cane to the left.  Closure of Peterson's defect. Upper endoscopy. Posterior hiatal hernia repair  Description of Procedure:  The patient was taken to OR 1 at [facility] and given general anesthesia.  The abdomen was prepped with PCMX and draped sterilely.  A time out was performed.    The operation began by identifying the ligament of Treitz. I measured 40 cm downstream and divided the bowel with a 6 cm Covidian stapler.  I sutured a Penrose drain along the Roux limb end.  I measured a 1 meter (100 cm) Roux limb and then placed the distal bowels to the BP limb side by side and performed a stapled jejunojejunostomy. The common defect was closed from either end with 4-0 Vicryl using the Endo Stitch. The mesenteric defect was closed with a running 2-0 silk using the Endo Stitch. Tisseel was applied to the suture line.  The omentum was divided with the harmonic scalpel.  The Nathanson retractor was inserted in the left lateral segment of liver was retracted. The foregut dissection ensued.  A small gastric pouch was created by dissecting 5 cm along the lesser curvature applying multiple applications of the Covidian purple load.  Bleeding was controlled. I also closed a hiatal hernia there was a visible dimple by dissecting exterior and using the Endo Stitch applying a single stitch approximating the right and left crura.  The Roux limb was then brought up with the candycane pointed left and a back row of sutures of 2-0 Vicryl were placed. I opened along the right side of each structure and inserted the 4.5 cm stapler to create the gastrojejunostomy. The common defect was closed from either end with 2-0 Vicryl and a second row was placed anterior to that the Ewald tube acting as a stent across the  anastomosis. The Penrose drain was removed. Peterson's defect was closed with 2-0 silk.   Endoscopy was performed by Dr. Biagio Quint.  The pouch was small, a patent anastomosis was noted, and no bleeding was seen.  The incisions were injected with Exparel and were closed with 4-0 Vicryl and steri strips  The patient was taken to the recovery room in satisfactory condition.  [name], MD, FACS

## 2011-01-28 NOTE — Anesthesia Procedure Notes (Addendum)
Date/Time: 01/28/2011 12:16 PM Performed by: Hulan Fess Pre-anesthesia Checklist: Patient identified, Emergency Drugs available, Suction available and Patient being monitored Patient Re-evaluated:Patient Re-evaluated prior to inductionOxygen Delivery Method: Circle System Utilized Preoxygenation: Pre-oxygenation with 100% oxygen Intubation Type: IV induction Ventilation: Mask ventilation without difficulty Laryngoscope Size: Mac and 3 Grade View: Grade I Tube type: Oral Tube size: 8.0 mm Number of attempts: 1 Airway Equipment and Method: oral airway Placement Confirmation: ETT inserted through vocal cords under direct vision,  positive ETCO2 and breath sounds checked- equal and bilateral Secured at: 22 cm Dental Injury: Teeth and Oropharynx as per pre-operative assessment and Injury to lip

## 2011-01-28 NOTE — Preoperative (Signed)
Beta Blockers   Reason not to administer Beta Blockers:Not Applicable 

## 2011-01-28 NOTE — Anesthesia Preprocedure Evaluation (Addendum)
Anesthesia Evaluation  Patient identified by MRN, date of birth, ID band Patient awake    Reviewed: Allergy & Precautions, H&P , NPO status , Patient's Chart, lab work & pertinent test results  Airway Mallampati: II TM Distance: >3 FB Neck ROM: Full    Dental  (+) Teeth Intact and Dental Advisory Given   Pulmonary neg pulmonary ROS,  No cpap clear to auscultation        Cardiovascular Exercise Tolerance: Good neg cardio ROS Regular     Neuro/Psych Negative Neurological ROS  Negative Psych ROS   GI/Hepatic negative GI ROS, Neg liver ROS,   Endo/Other  Negative Endocrine ROS  Renal/GU negative Renal ROS  Genitourinary negative   Musculoskeletal negative musculoskeletal ROS (+)   Abdominal   Peds negative pediatric ROS (+)  Hematology negative hematology ROS (+)   Anesthesia Other Findings   Reproductive/Obstetrics negative OB ROS                          Anesthesia Physical Anesthesia Plan  ASA: II  Anesthesia Plan: General   Post-op Pain Management:    Induction: Intravenous  Airway Management Planned: Oral ETT  Additional Equipment:   Intra-op Plan:   Post-operative Plan: Extubation in OR  Informed Consent: I have reviewed the patients History and Physical, chart, labs and discussed the procedure including the risks, benefits and alternatives for the proposed anesthesia with the patient or authorized representative who has indicated his/her understanding and acceptance.   Dental advisory given  Plan Discussed with: CRNA  Anesthesia Plan Comments:         Anesthesia Quick Evaluation

## 2011-01-29 ENCOUNTER — Inpatient Hospital Stay (HOSPITAL_COMMUNITY): Payer: 59

## 2011-01-29 ENCOUNTER — Encounter (HOSPITAL_COMMUNITY): Payer: Self-pay | Admitting: Surgery

## 2011-01-29 DIAGNOSIS — M79609 Pain in unspecified limb: Secondary | ICD-10-CM

## 2011-01-29 MED ORDER — IOHEXOL 300 MG/ML  SOLN
50.0000 mL | Freq: Once | INTRAMUSCULAR | Status: AC | PRN
  Administered 2011-01-29: 50 mL via ORAL

## 2011-01-29 MED ORDER — HYDRALAZINE HCL 20 MG/ML IJ SOLN
10.0000 mg | Freq: Once | INTRAMUSCULAR | Status: AC
  Administered 2011-01-29: 10 mg via INTRAVENOUS
  Filled 2011-01-29: qty 1

## 2011-01-29 MED ORDER — UNJURY CHOCOLATE CLASSIC POWDER
2.0000 [oz_av] | Freq: Four times a day (QID) | ORAL | Status: DC
  Administered 2011-01-30: 2 [oz_av] via ORAL

## 2011-01-29 MED ORDER — UNJURY CHICKEN SOUP POWDER: 2.0000 [oz_av] | Freq: Four times a day (QID) | ORAL | Status: DC

## 2011-01-29 MED ORDER — UNJURY VANILLA POWDER: 2.0000 [oz_av] | Freq: Four times a day (QID) | ORAL | Status: DC

## 2011-01-29 NOTE — Progress Notes (Signed)
GASTRIC BYPASS DISCHARGE INSTRUCTIONS  Drs. Fredrik Rigger, Hoxworth, Wilson, and Cope Call if you have any problems.   Call [phone] and ask for the surgeon on call.    If you need immediate assistance come to the ER at [facility]. Tell the ER personnel that you are a new post-op gastric bypass patient. Signs and symptoms to report:   Severe vomiting or nausea. If you cannot tolerate clear liquids for longer than 1 day, you need to call your surgeon.    Abdominal pain which does not get better after taking your pain medication   Fever greater than 101 F degree   Difficulty breathing   Chest pain    Redness, swelling, drainage, or foul odor at incision sites    If your incisions open or pull apart   Swelling or pain in calf (lower leg)   Diarrhea, frequent watery, uncontrolled bowel movements.   Constipation, (no bowel movements for 3 days) if this occurs, Take Milk of Magnesia, 2 tablespoons by mouth, 3 times a day for 2 days if needed.  Call your doctor if constipation continues. Stop taking Milk of Magnesia once you have had a bowel movement. You may also use Miralax according to the label instructions.   Anything you consider "abnormal for you".   Normal side effects after Surgery:   Unable to sleep at night or concentrate   Irritability   Being tearful (crying) or depressed   These are common complaints, possibly related to your anesthesia, stress of surgery and change in lifestyle, that usually go away a few weeks after surgery.  If these feelings continue, call your medical doctor.  Wound Care You may have surgical glue, steri-strips, or staples over your incisions after surgery.  Surgical glue:  Looks like a clear film over your incisions and will wear off gradually. Steri-strips: Strips of tape over your incisions. You may notice a yellowish color on the skin underneath the steri-strips. This is a substance used to make the steri-strips stick better. Do not pull the  steri-strips off - let them fall off.  Staples: Cherlynn Polo may be removed before you leave the hospital. If you go home with staples, call [facility] [phone]) for an appointment with your surgeon's nurse to have staples removed in 7 - 10 days. Showering: You may shower two days after your surgery unless otherwise instructed by your surgeon. Wash gently around wounds with warm soapy water, rinse well, and gently pat dry.  If you have a drain, you may need someone to hold this while you shower. Avoid tub baths until staples are removed and incisions are healed.    Medications   Medications should be liquid or crushed if larger than the size of a dime.  Extended release pills should not be crushed.   Depending on the size and number of medications you take, you may need to stagger/change the time you take your medications so that you do not over-fill your pouch.    Make sure you follow-up with your primary care physician to make medication adjustments needed during rapid weight loss and life-style adjustment.   If you are diabetic, follow up with the doctor that prescribes your diabetes medication(s) within one week after surgery and check your blood sugar regularly.   Do not drive while taking narcotics!   Do not take acetaminophen (Tylenol) and Roxicet or Lortab Elixir at the same time since these pain medications contain acetaminophen.  Diet at home: (First 2 Weeks) You  will see the nutritionist two weeks after your surgery. She will advance your diet if you are tolerating liquids well. Once at home, if you have severe vomiting or nausea and cannot tolerate clear liquids lasting longer than 1 day, call your surgeon.  Begin high protein shake 2 ounces every 3 hours, 5 - 6 times per day.  Gradually increase the amount you drink as tolerated.  You may find it easier to slowly sip shakes throughout the day.  It is important to get your proteins in first.   Protein Shake   Drink at  least 2 ounces of shake 5-6 times per day   Each serving of protein shakes should have a minimum of 15 grams of protein and no more than 5 grams of carbohydrate    Increase the amount of protein shake you drink as tolerated   Protein powder may be added to fluids such as non-fat milk or Lactaid milk (limit to 20 grams added protein powder per serving   The initial goal is to drink at least 8 ounces of protein shake/drink per day (or as directed by the nutritionist). Some examples of protein shakes are [employer], [employer], EAS Edge HP, and Unjury. Hydration   Gradually increase the amount of water and other liquids as tolerated (See Acceptable Fluids)   Gradually increase the amount of protein shake as tolerated     Sip fluids slowly and throughout the day   May use Sugar substitutes, use sparingly (limit to 6 - 8 packets per day). Your fluid goal is 64 ounces of fluid daily. It may take a few weeks to build up to this.         32 oz (or more) should be clear liquids and 32 oz (or more) should be full liquids.         Liquids should not contain sugar, caffeine, or carbonation! Acceptable Fluids Clear Liquids:   Water or Sugar-free flavored water, Fruit H2O   Decaffeinated coffee or tea (sugar-free)   Crystal Lite, Wyler's Lite, Minute Maid Lite   Sugar-free Jell-O   Bouillon or broth   Sugar-free Popsicle:   *Less than 20 calories each; Limit 1 per day   Full Liquids:              Protein Shakes/Drinks + 2 choices per day of other full liquids shown below.    Other full liquids must be o No more than 12 grams of Carbs per serving,  o No more than 3 grams of Fat per serving   Strained low-fat cream soup   Non-Fat milk   Fat-free Lactaid Milk   Sugar-free yogurt (Dannon Lite & Fit) Vitamins and Minerals (Start 1 day after surgery unless otherwise directed)   2 Chewable Multivitamin / Multimineral Supplement (i.e. Centrum for Adults)   Chewable Calcium Citrate with Vitamin  D-3. Take 1500 mg each day.           (Example: 3 Chewable Calcium Plus 600 with Vitamin D-3 can be found at [facility])         Vitamin B-12, 350 - 500 micrograms (oral tablet) each day   Do not mix multivitamins containing iron with calcium supplements; take 2 hours   apart   Do not substitute Tums (calcium carbonate) for your calcium   Menstruating women and those at risk for anemia may need extra iron. Talk with your doctor to see if you need additional iron.    If you need extra iron:  Total daily Iron recommendations (including Vitamins) = 50 - 100 mg Iron/day Do not stop taking or change any vitamins or minerals until you talk to your nutritionist or surgeon. Your nutritionist and / or physician must approve all vitamin and mineral supplements. Exercise For maximum success, begin exercising as soon as your doctor recommends. Make sure your physician approves any physical activity.   Depending on fitness level, begin with a simple walking program   Walk 5-15 minutes each day, 7 days per week.    Slowly increase until you are walking 30-45 minutes per day   Consider joining our BELT program. [phone] or email belt@[employer] .edu Things to remember:    You may have sexual relations when you feel comfortable. It is VERY important for female patients to use a reliable birth control method. Fertility often increases after surgery. Do not get pregnant for at least 18 months.   It is very important to keep all follow up appointments with your surgeon, nutritionist, primary care physician, and behavioral health practitioner. After the first year, please follow up with your bariatric surgeon at least once a year in order to maintain best weight loss results.  [facility]: [phone] [facility]: [phone]   Free counseling is available for you and your family through collaboration between [facility] and [place]. Please call [phone] and leave a  message.    Consider purchasing a medical alert bracelet that says you had gastric bypass surgery.    The [facility] has a free Bariatric Surgery Support Group that meets monthly, the 3rd Thursday, 6 pm, Classroom #1, [place]. You may register online at [email or web address], but registration is not necessary. Select Classes and Support Groups, Bariatric Surgery, or Call [phone]   Do not return to work or drive until cleared by your surgeon   Use your CPAP when sleeping if applicable   Do not lift anything greater than ten pounds for at least two weeks  THese were the instructions given to the pt to review and will complete tomorrow.  Talmadge Chad, RN

## 2011-01-29 NOTE — Progress Notes (Signed)
Pt given ice chips and instructed to suck on them slowly allowing ice to melt, do not chew or crush. Pt verbalized understanding.

## 2011-01-29 NOTE — Progress Notes (Signed)
Bilateral lower extremity venous duplex completed at 08:25.  Preliminary report is negative for DVT, SVT, or a Baker's cyst. Smiley Houseman 01/29/2011, 9:28 AM

## 2011-01-29 NOTE — Progress Notes (Signed)
Pt returned from swallow study; alert and oriented; VSS; Doppler study negative; pt ambulating in halls without difficulty; voiding without difficulty; denies nausea or vomiting; complaining of gas pain: encouraged ambulation; awaiting results from swallow study then if normal will proceed to sips of water; discharge instructions given to pt to review and will follow up tomorrow; continue current plan of care.  Talmadge Chad, RN

## 2011-01-29 NOTE — Progress Notes (Signed)
Patient ID: Emma Gonzales, female   DOB: [birth year], 45 y.o.   MRN: [id number] [facility] Surgery Progress Note:   1 Day Post-Op  Subjective: No complaints.  Incisons are painless Objective: Vital signs in last 24 hours: Temp:  [97.1 F (36.2 C)-99.1 F (37.3 C)] 98.5 F (36.9 C) (11/20 0700) Pulse Rate:  [53-99] 72  (11/20 0700) Resp:  [16-20] 18  (11/20 0700) BP: (125-165)/(76-93) 143/83 mmHg (11/20 0700) SpO2:  [99 %-100 %] 100 % (11/20 0700) Weight:  [314 lb (142.429 kg)-314 lb 13.1 oz (142.8 kg)] 314 lb (142.429 kg) (11/19 1930)  Intake/Output from previous day: 11/19 0701 - 11/20 0700 In: 4238 [I.V.:4238] Out: 2800 [Urine:2750; Blood:50] Intake/Output this shift:    Physical Exam:  Abdomen is nontender Lab Results:   [facility] 01/28/11 1957  WBC 10.1  HGB 13.4  HCT 42.0  PLT 265   BMET  Basename 01/28/11 1957  NA --  K --  CL --  CO2 --  GLUCOSE --  BUN --  CREATININE 0.75  CALCIUM --   PT/INR No results found for this basename: LABPROT:2,INR:2 in the last 72 hours Studies/Results: No results found. Anti-infectives: Anti-infectives     Start     Dose/Rate Route Frequency Ordered Stop   01/28/11 1100   cefOXitin (MEFOXIN) 2 g in dextrose 5 % 50 mL IVPB        2 g 100 mL/hr over 30 Minutes Intravenous  Once 01/27/11 2105 01/28/11 1210          Assessment/Plan: Problem List: Patient Active Problem List  Diagnoses  . Obesity    Await swallow today.   1 Day Post-Op    LOS: 1 day   [name], MD, [facility] [phone] beeper [phone]  01/29/2011 7:40 AM

## 2011-01-30 LAB — CBC
Platelets: 256 10*3/uL (ref 150–400)
RBC: 4.52 MIL/uL (ref 3.87–5.11)
WBC: 7.6 10*3/uL (ref 4.0–10.5)

## 2011-01-30 LAB — DIFFERENTIAL
Lymphocytes Relative: 28 % (ref 12–46)
Lymphs Abs: 2.1 10*3/uL (ref 0.7–4.0)
Neutro Abs: 4.8 10*3/uL (ref 1.7–7.7)
Neutrophils Relative %: 63 % (ref 43–77)

## 2011-01-30 NOTE — Progress Notes (Signed)
Pt alert and oriented; VSS; pt verbalized feeling much better since able to pass gas; denies nausea or vomiting; voiding without difficulty; ambulating without difficulty; denies pain at this time; verbalized desire to go home today; pt already has [facility] and [facility] follow up appointments; discharge instructions reviewed and pt verbalized understanding of.  Talmadge Chad, RN

## 2011-01-30 NOTE — Progress Notes (Signed)
Patient ID: Emma Gonzales, female   DOB: [birth year], 45 y.o.   MRN: [id number] [facility] Surgery Progress Note:   2 Days Post-Op  Subjective: Doing well.  Wants to go home Objective: Vital signs in last 24 hours: Temp:  [97.6 F (36.4 C)-99.1 F (37.3 C)] 97.9 F (36.6 C) (11/21 1000) Pulse Rate:  [58-71] 68  (11/21 1000) Resp:  [17-18] 18  (11/21 1000) BP: (123-170)/(72-94) 123/84 mmHg (11/21 1000) SpO2:  [97 %-100 %] 100 % (11/21 1000)  Intake/Output from previous day: 11/20 0701 - 11/21 0700 In: 1669 [P.O.:180; I.V.:1489] Out: 3875 [Urine:3875] Intake/Output this shift: Total I/O In: -  Out: 400 [Urine:400]  Physical Exam:  Incisions ok  Lab Results:   Basename 01/30/11 0404 01/28/11 1957  WBC 7.6 10.1  HGB 13.1 13.4  HCT 41.0 42.0  PLT 256 265   BMET  Basename 01/28/11 1957  NA --  K --  CL --  CO2 --  GLUCOSE --  BUN --  CREATININE 0.75  CALCIUM --   PT/INR No results found for this basename: LABPROT:2,INR:2 in the last 72 hours Studies/Results: Dg Ugi W/water Sol Cm  01/29/2011  *RADIOLOGY REPORT*  Clinical Data:Post gastric bypass and hernia repair.  WATER SOLUBLE UPPER GI SERIES  Technique:  Single-column upper GI series was performed using water soluble contrast.  Fluoroscopy Time: 1.39 minutes  Contrast: 50 ml Omnipaque-300.  Comparison: 09/25/2010  Findings: Scout view reveals atelectatic changes left lung base. Coils in the pelvis may be related to prior fallopian tube placement.  Clinical correlation recommended.  With ingestion of contrast, there is minimal holdup at the level of the hiatal hernia repair.  No leak identified.  Distal anastomoses not visualized despite delayed imaging.  No proximal obstruction.  This may reflect slow transit time.  IMPRESSION: With ingestion of contrast, there is minimal holdup at the level of the hiatal hernia repair.  No leak identified.  Distal anastomoses not visualized despite delayed imaging.  No proximal  obstruction.  This may reflect slow transit time.  Original Report Authenticated By: Fuller Canada, M.D.   Anti-infectives: Anti-infectives     Start     Dose/Rate Route Frequency Ordered Stop   01/28/11 1100   cefOXitin (MEFOXIN) 2 g in dextrose 5 % 50 mL IVPB        2 g 100 mL/hr over 30 Minutes Intravenous  Once 01/27/11 2105 01/28/11 1210          Assessment/Plan: Problem List: Patient Active Problem List  Diagnoses  . Obesity    Ready for discharge.  Instructions given 2 Days Post-Op    LOS: 2 days   [name], MD, [facility] [phone] beeper [phone]  01/30/2011 10:48 AM

## 2011-01-30 NOTE — Discharge Summary (Signed)
Physician Discharge Summary  Patient ID: Emma Gonzales MRN: [id number] DOB/AGE: 45-Aug-1967 45 y.o.  Admit date: 01/28/2011 Discharge date: 01/30/2011  Admission Diagnoses:  Discharge Diagnoses:  Active Problems:  * No active hospital problems. *    Discharged Condition: good  Hospital Course: Admitted following roux y gastric bypass. UGI on PD 1 looked good.  Taking liquids and ready for discharge on PD 2.    Consults: none  Significant Diagnostic Studies: radiology: UGI--looked good  Treatments: surgery: gastric bypass  Discharge Exam: Blood pressure 123/84, pulse 68, temperature 97.9 F (36.6 C), temperature source Oral, resp. rate 18, height 5\' 1"  (1.549 m), weight 314 lb (142.429 kg), SpO2 100.00%. Incision OK .  Walking fine.  Work of breathing OK  Disposition:   Discharge Orders    Future Appointments: Provider: Department: Dept Phone: Center:   02/12/2011 4:00 PM [facility] Post-Op Class [facility] [phone] N[facility]   02/14/2011 9:20 AM Valarie Merino, MD [facility] [phone] None     Future Orders Please Complete By Expires   Diet Carb Modified      Increase activity slowly      Discharge instructions      Comments:   Instructions given    Remove dressing in 24 hours        Current Discharge Medication List    CONTINUE these medications which have NOT CHANGED   Details  Cholecalciferol (VITAMIN D) 2000 UNITS CAPS Take 1 capsule by mouth daily.      citalopram (CELEXA) 20 MG tablet Take 20 mg by mouth every evening.     HYDROcodone-acetaminophen (VICODIN) 5-500 MG per tablet 1 tablet every 8 (eight) hours as needed. pain         Signed: Dimitria Ketchum B 01/30/2011, 10:56 AM

## 2011-02-12 ENCOUNTER — Encounter: Payer: 59 | Attending: Surgery

## 2011-02-12 DIAGNOSIS — Z713 Dietary counseling and surveillance: Secondary | ICD-10-CM | POA: Insufficient documentation

## 2011-02-12 DIAGNOSIS — Z01818 Encounter for other preprocedural examination: Secondary | ICD-10-CM | POA: Insufficient documentation

## 2011-02-12 NOTE — Progress Notes (Signed)
  Bariatric Class:  Appt start time: 1600 end time:  1630.  2 Week Post-Operative Nutrition Class  Patient was seen on 02/12/2011 for Post-Operative Nutrition [facility]   Surgery date: 01/28/11  Surgery type: Gastric Bypass   Weight today: 302.1 Weight change: 16.6 lbs Total weight lost: 18.8 lbs total BMI: 51.2% Start wt @ [facility]: 320.9 lbs  Weight today: 318.7 lbs   The following the learning objective met the patient during this course:   Identifies Phase 3A (Soft, High Proteins) Dietary Goals and will begin from 2 weeks post-operatively to 2 months post-operatively   Identifies appropriate sources of fluids and proteins   States protein recommendations and appropriate sources post-operatively  Identifies the need for appropriate texture modifications, mastication, and bite sizes when consuming solids  Identifies appropriate multivitamin and calcium sources post-operatively  Describes the need for physical activity post-operatively and will follow MD recommendations  States when to call healthcare provider regarding medication questions or post-operative complications  Handouts given during class include:  Phase 3A: Soft, High Protein Diet Handout  Band Fill Guidelines Handout  Follow-Up Plan: Patient will follow-up at [facility] in 6 weeks for 2 months post-op nutrition visit for diet advancement per MD.

## 2011-02-12 NOTE — Patient Instructions (Signed)
Patient to follow Phase 3A-Soft, High Protein Diet and follow-up at [facility] in 6 weeks for 2 months post-op nutrition visit for diet advancement. 

## 2011-02-14 ENCOUNTER — Ambulatory Visit (INDEPENDENT_AMBULATORY_CARE_PROVIDER_SITE_OTHER): Payer: 59 | Admitting: Surgery

## 2011-02-14 ENCOUNTER — Encounter (INDEPENDENT_AMBULATORY_CARE_PROVIDER_SITE_OTHER): Payer: Self-pay | Admitting: Surgery

## 2011-02-14 VITALS — BP 116/84 | HR 56 | Temp 97.6°F | Resp 16 | Ht 64.0 in | Wt 301.2 lb

## 2011-02-14 DIAGNOSIS — Z9884 Bariatric surgery status: Secondary | ICD-10-CM

## 2011-02-14 NOTE — Progress Notes (Signed)
Emma Gonzales 45 y.o.  Body mass index is 51.70 kg/(m^2).  Patient Active Problem List  Diagnoses  . Obesity    No Known Allergies  Past Surgical History  Procedure Date  . Tubal ligation   . Endometrial ablation w/ novasure   . Cholecystectomy 01-24-11    '96-Laparoscopic  . Foot surgery 01-24-11    bilateral  . Gastric roux-en-y 01/28/2011    Procedure: LAPAROSCOPIC ROUX-EN-Y GASTRIC;  Surgeon: Valarie Merino, MD;  Location: [facility];  Service: General;  Laterality: N/A;  With upper endoscopy   [name], MD, MD No diagnosis found.  Doing very well thus far.  Will see back in 4 months [name], MD, [facility] [phone] beeper [phone]  02/14/2011 10:11 AM

## 2011-02-14 NOTE — Patient Instructions (Signed)
Follow dietary instructions Increase activity level Applied moisturizing skin cream to incisions Try Benadryl for itching

## 2011-03-08 ENCOUNTER — Encounter (INDEPENDENT_AMBULATORY_CARE_PROVIDER_SITE_OTHER): Payer: Self-pay

## 2011-03-28 ENCOUNTER — Encounter: Payer: Self-pay | Admitting: *Deleted

## 2011-03-28 ENCOUNTER — Encounter: Payer: 59 | Attending: Surgery | Admitting: *Deleted

## 2011-03-28 DIAGNOSIS — Z01818 Encounter for other preprocedural examination: Secondary | ICD-10-CM | POA: Insufficient documentation

## 2011-03-28 DIAGNOSIS — E669 Obesity, unspecified: Secondary | ICD-10-CM

## 2011-03-28 DIAGNOSIS — Z713 Dietary counseling and surveillance: Secondary | ICD-10-CM | POA: Insufficient documentation

## 2011-03-28 NOTE — Progress Notes (Signed)
  Follow-up visit: 8 Weeks Post-Operative Gastric Bypass Surgery  Medical Nutrition Therapy:  Appt start time: 1550 end time:  1625.  Assessment:  Primary concerns today: post-operative bariatric surgery nutrition management. Emma Gonzales is doing well after RNY Gastric Bypass. She reports minimal nutrition related problems and is eager to add more vegetables to her diet. She is thrilled with her weight loss.  Surgery date: 01/28/11  Surgery type: Gastric Bypass  Start wt @ [facility]: 320.9 lbs  Weight today: 288 lbs Weight change: 14.1 lbs Total weight lost: 32.9 lbs total BMI: 48.8% Weight goal: 180 lbs  24-hr recall:  B (5 AM): 1 oz cheese, 1/2 cup applesauce Snk (9  AM): yogurt cup OR 1 pack of oatmeal   L (12 PM): Lean Cuisine Meal Snk (3-4 PM): granola bar  D (6-8 PM): Chicken or Pork Chops (baked) w/ Green beans OR Salad Snk (PM): n/a  Fluid intake: water, crystal light, decaf coffee = 32 oz Estimated total protein intake: 40-45g  Medications: Pt on Mobic; See updated medication list Supplementation: Taking regularly; No problems reported  Using straws: No Drinking while eating: Minimally Hair loss: No Carbonated beverages: No N/V/D/C: Occasional constipation; Uses Miralax PRN Dumping syndrome: None reported  Recent physical activity:  Minimal walking due to pain in arms/legs; Continues to be very active at work.  Progress Towards Goal(s):  In progress.  Handouts given during visit include:  Phase 3B - High Protein + Non-starchy vegetable Phase   Nutritional Diagnosis:  [place]-3.3 Overweight/obesity As related to recent RNY Gastric Bypass surgery.  As evidenced by pt with continued adherence to post-op gastric bypass nutrition dietary recommendations.    Intervention:  Nutrition education.  Monitoring/Evaluation:  Dietary intake, exercise, lap band fills, and body weight. Follow up in 6 months for 3-4 month post-op visit.

## 2011-03-28 NOTE — Patient Instructions (Signed)
Goals:  Follow Phase 3B: High Protein + Non-Starchy Vegetables  Eat 3-6 small meals/snacks, every 3-5 hrs  Increase lean protein foods to meet 60-85g goal  Increase fluid intake to 64oz +  Avoid drinking 15 minutes before, during and 30 minutes after eating  Aim for >30 min of physical activity daily

## 2011-05-09 ENCOUNTER — Ambulatory Visit: Payer: 59 | Admitting: *Deleted

## 2011-05-13 ENCOUNTER — Encounter: Payer: Self-pay | Admitting: *Deleted

## 2011-05-13 ENCOUNTER — Encounter: Payer: 59 | Attending: Surgery | Admitting: *Deleted

## 2011-05-13 DIAGNOSIS — Z01818 Encounter for other preprocedural examination: Secondary | ICD-10-CM | POA: Insufficient documentation

## 2011-05-13 DIAGNOSIS — Z713 Dietary counseling and surveillance: Secondary | ICD-10-CM | POA: Insufficient documentation

## 2011-05-13 NOTE — Patient Instructions (Signed)
Goals:  Continue to follow Phase 3B: High Protein + Non-Starchy Vegetables  Eat 3-6 small meals/snacks, every 3-5 hrs  Increase lean protein foods to meet 60-85g goal  Increase fluid intake to 64oz +  Avoid carbonated and caffeinated beverages  Avoid drinking 15 minutes before, during and 30 minutes after eating  Aim for >30 min of physical activity daily 

## 2011-05-13 NOTE — Progress Notes (Signed)
  Follow-up visit:  3 month Post-Operative Gastric Bypass Surgery  Medical Nutrition Therapy:  Appt start time: 1550 end time:  1625.  Assessment:  Primary concerns today: Post-operative bariatric surgery nutrition management.  Emma Gonzales returns for 3 mo f/u of Gastric Bypass with a wt loss of 17.6 lbs since last visit. Recently started exercising and doing well with diet. No problems reported.  Surgery date: 01/28/11   Surgery type: Gastric Bypass  Start wt @ [facility]: 320.9 lbs  Weight today: 270.4 lbs Weight change: 17.6 lbs Total weight lost: 50.5 lbs total BMI: 45.7% Weight goal: 180 lbs  TANITA  BODY COMP RESULTS (copy located in Media)  05/13/11     %Fat 51.1     FM (lbs) 137.5     FFM (lbs) 131.5     TBW (lbs) 96.5      24-hr recall: Snk (6 AM): (6) PB crackers (nabs) B (9  AM): greek yogurt cup OR 1 pack of oatmeal  L (12 PM): Lean Cuisine Meal (lasagna), SF Jello Snk (3-4 PM): Protein shake (30g pro)  D (6-8 PM): Chicken (baked) w/ stir fry vegetables Snk (PM):  None  Fluid intake: water, crystal light, decaf coffee = 35-40 oz Estimated total protein intake: 65-70 g  Medications: See medication list; No changes reported. Supplementation: Taking regularly; No problems reported  Using straws: No Drinking while eating: No Hair loss: No Carbonated beverages: No N/V/D/C: No Dumping syndrome: Only with cake/ice cream she tried recently.  Recent physical activity: Purchased stationary bike last week and rode 2 times. Plans to ride >2d/wk.  Progress Towards Goal(s):  In progress.  Nutritional Diagnosis:  [place]-3.3 Overweight/obesity As related to recent RNY Gastric Bypass surgery.  As evidenced by pt with continued adherence to post-op gastric bypass nutrition dietary recommendations.    Intervention:  Nutrition education.  Monitoring/Evaluation:  Dietary intake, exercise, and body weight. Follow up in 2-3 months for 6-7 month post-op visit.

## 2011-05-14 ENCOUNTER — Other Ambulatory Visit (HOSPITAL_COMMUNITY): Payer: Self-pay | Admitting: Obstetrics and Gynecology

## 2011-05-14 DIAGNOSIS — Z1231 Encounter for screening mammogram for malignant neoplasm of breast: Secondary | ICD-10-CM

## 2011-05-20 ENCOUNTER — Encounter (INDEPENDENT_AMBULATORY_CARE_PROVIDER_SITE_OTHER): Payer: Self-pay | Admitting: Surgery

## 2011-06-05 ENCOUNTER — Ambulatory Visit (HOSPITAL_COMMUNITY)
Admission: RE | Admit: 2011-06-05 | Discharge: 2011-06-05 | Disposition: A | Discharge status: Home / Self Care | Payer: 59 | Source: Ambulatory Visit | Attending: Obstetrics and Gynecology | Admitting: Obstetrics and Gynecology

## 2011-06-05 DIAGNOSIS — Z1231 Encounter for screening mammogram for malignant neoplasm of breast: Secondary | ICD-10-CM | POA: Insufficient documentation

## 2011-06-10 ENCOUNTER — Ambulatory Visit: Payer: 59 | Admitting: *Deleted

## 2011-06-14 ENCOUNTER — Ambulatory Visit (INDEPENDENT_AMBULATORY_CARE_PROVIDER_SITE_OTHER): Payer: 59 | Admitting: Internal Medicine

## 2011-06-14 ENCOUNTER — Encounter: Payer: Self-pay | Admitting: Internal Medicine

## 2011-06-14 VITALS — BP 112/74 | HR 80 | Temp 99.0°F | Ht 65.0 in | Wt 267.0 lb

## 2011-06-14 DIAGNOSIS — J069 Acute upper respiratory infection, unspecified: Secondary | ICD-10-CM

## 2011-06-14 DIAGNOSIS — Z87898 Personal history of other specified conditions: Secondary | ICD-10-CM

## 2011-06-14 DIAGNOSIS — Z8669 Personal history of other diseases of the nervous system and sense organs: Secondary | ICD-10-CM | POA: Insufficient documentation

## 2011-06-14 DIAGNOSIS — R002 Palpitations: Secondary | ICD-10-CM

## 2011-06-14 NOTE — Patient Instructions (Signed)
Takes Sudafed PE for nasal congestion, Chlor-Trimeton for runny nose. Take Zithromax Z-PAK as prescribed. Drink plenty of fluids and get plenty of rest over the weekend. Call if not better in one week or sooner if worse.

## 2011-06-14 NOTE — Progress Notes (Signed)
  Subjective:    Patient ID: Emma Gonzales, female    DOB: May 12, 1965, 46 y.o.   MRN: [id number]  HPI 46 year old black female with history of obesity and sleep apnea as well as palpitations in today for URI symptoms. Had onset yesterday. Complaining of runny nose headache malaise and fatigue. No fever. Denies sore throat.    Review of Systems     Objective:   Physical Exam HEENT exam: Pharynx slightly injected. TMs are full bilaterally but not red; neck is supple without significant adenopathy; chest clear        Assessment & Plan:  URI  Plan: Patient is to take Sudafed PE and Chlor-Trimeton as needed for congestion and runny nose. Zithromax Z-PAK take 2 tablets by mouth day one followed by 1 tablet by mouth days 2 through 5 with no refill.

## 2011-06-18 ENCOUNTER — Encounter (INDEPENDENT_AMBULATORY_CARE_PROVIDER_SITE_OTHER): Payer: Self-pay | Admitting: Surgery

## 2011-06-19 ENCOUNTER — Ambulatory Visit (INDEPENDENT_AMBULATORY_CARE_PROVIDER_SITE_OTHER): Payer: 59 | Admitting: Surgery

## 2011-06-19 ENCOUNTER — Encounter (INDEPENDENT_AMBULATORY_CARE_PROVIDER_SITE_OTHER): Payer: Self-pay | Admitting: Surgery

## 2011-06-19 VITALS — BP 118/82 | HR 68 | Temp 97.8°F | Ht 64.5 in | Wt 266.8 lb

## 2011-06-19 DIAGNOSIS — Z9884 Bariatric surgery status: Secondary | ICD-10-CM

## 2011-06-19 NOTE — Progress Notes (Signed)
Emma Gonzales is 4.7 months postop and her weight today is 266.8 which means she has lost 58.6 pounds or about 18% of her excess weight. Today's BMIs down to 45. She looks really good and is often a good start with a Roux-en-Y gastric bypass. Her incisions have healed nicely I plan to see her again in 4 months.  Emma Gonzales 46 y.o.  Body mass index is 45.09 kg/(m^2).  Patient Active Problem List  Diagnoses  . Obesity  . Palpitations  . History of sleep apnea    No Known Allergies  Past Surgical History  Procedure Date  . Tubal ligation   . Endometrial ablation w/ novasure   . Cholecystectomy 01-24-11    '96-Laparoscopic  . Foot surgery 01-24-11    bilateral  . Gastric roux-en-y 01/28/2011    Procedure: LAPAROSCOPIC ROUX-EN-Y GASTRIC;  Surgeon: Valarie Merino, MD;  Location: [facility];  Service: General;  Laterality: N/A;  With upper endoscopy   Emma Mackintosh, MD, MD No diagnosis found.  Reassess in 4 months.  Emma B. Daphine Deutscher, MD, [facility] [phone] beeper [phone]  06/19/2011 5:27 PM

## 2011-08-08 ENCOUNTER — Encounter: Payer: Self-pay | Admitting: Internal Medicine

## 2011-08-08 ENCOUNTER — Ambulatory Visit (INDEPENDENT_AMBULATORY_CARE_PROVIDER_SITE_OTHER): Payer: 59 | Admitting: Internal Medicine

## 2011-08-08 VITALS — BP 116/68 | HR 80 | Temp 99.3°F | Wt 249.0 lb

## 2011-08-08 DIAGNOSIS — J029 Acute pharyngitis, unspecified: Secondary | ICD-10-CM

## 2011-08-08 NOTE — Patient Instructions (Signed)
Take Augmentin 500 mg 3 times daily for 10 days. Take Vicodin 5/500 sparingly every 8 hours as needed for headache. You are contagious for 24 hours after starting medication.

## 2011-08-08 NOTE — Progress Notes (Signed)
  Subjective:    Patient ID: Emma Gonzales, female    DOB: 1965/11/17, 46 y.o.   MRN: [id number]  HPI 46 year old black female in today with complaint of sore throat and headache. Also has some respiratory congestion. Says daughter who has been sick recently hugged her. Apparently daughter has history of recurrent strep pharyngitis.    Review of Systems     Objective:   Physical Exam HEENT exam: TMs are clear; pharynx is red. No exudate noted. Rapid strep screen is positive. Patient has anterior cervical nodes bilaterally that are enlarged and slightly tender. Neck is supple. Chest clear.        Assessment & Plan:  Strep throat  Plan: Augmentin 500 mg 3 times daily for 10 days. Take with food. Vicodin 5/500 (#30) 1 by mouth every 6-8 hours as needed for headache. Patient to stay out of work tomorrow. Call if not better by Monday, June 3 or sooner if worse.

## 2011-08-12 ENCOUNTER — Encounter: Payer: 59 | Attending: Surgery | Admitting: *Deleted

## 2011-08-12 ENCOUNTER — Encounter: Payer: Self-pay | Admitting: *Deleted

## 2011-08-12 VITALS — Ht 64.5 in | Wt 252.0 lb

## 2011-08-12 DIAGNOSIS — Z9884 Bariatric surgery status: Secondary | ICD-10-CM

## 2011-08-12 DIAGNOSIS — Z713 Dietary counseling and surveillance: Secondary | ICD-10-CM | POA: Insufficient documentation

## 2011-08-12 DIAGNOSIS — Z01818 Encounter for other preprocedural examination: Secondary | ICD-10-CM | POA: Insufficient documentation

## 2011-08-12 DIAGNOSIS — E669 Obesity, unspecified: Secondary | ICD-10-CM

## 2011-08-12 NOTE — Progress Notes (Signed)
  Follow-up visit:  6 month Post-Operative Gastric Bypass Surgery  Medical Nutrition Therapy:  Appt start time: 1655  End time:  1625.  Primary concerns today: Post-operative bariatric surgery nutrition management.  Emma Gonzales returns for 6 mo f/u with loss of ~16 lbs of fat mass.  Reports she is not getting in her protein or fluids. "It's hard to keep up". Has also decreased exercise, though still active. Discussed ways to increase intake and exercise.   Surgery date: 01/28/11   Surgery type: Gastric Bypass  Start wt @ [facility]: 320.9 lbs  Weight today: 252.0 lbs Weight change: 18.4 lbs Total weight lost: 68.9 lbs total BMI: 42.6 kg/m^2 Weight goal: 180 lbs % goal met: 49%  TANITA  BODY COMP RESULTS   05/13/11 08/12/11  %Fat 51.1% 48.2%  FM (lbs) 137.5 121.5  FFM (lbs) 131.5 130.5  TBW (lbs) 96.5 95.5   24-hr recall: B (9  AM): greek yogurt cup OR 1 pack of oatmeal  (12g) L (12 PM): Lean Cuisine Meal, applesauce (13 g pro) Snk (3-4 PM): Protein bar (10-15g)  D (6-8 PM): Chicken (baked) w/ stir fry vegetables (20 g) Snk (PM):  None  Fluid intake: water, crystal light, coffee: 20-25 oz Estimated total protein intake: 50-55 g  Medications: See medication list; Not Supplementation: Taking regularly; No problems reported  Using straws: No Drinking while eating: No Hair loss: No Carbonated beverages: No N/V/D/C: No Dumping syndrome: None reported.  Recent physical activity:  [employer] [address] near home; riding stationary bike >2d/wk.  Progress Towards Goal(s):  In progress.  Nutritional Diagnosis:  [place]-3.3 Overweight/obesity As related to recent RNY Gastric Bypass surgery.  As evidenced by pt with continued adherence to post-op gastric bypass nutrition dietary recommendations.    Intervention:  Nutrition education.  Monitoring/Evaluation:  Dietary intake, exercise, and body weight. Follow up in 3 months for 9 month post-op visit.

## 2011-08-12 NOTE — Patient Instructions (Signed)
Goals:  Continue to follow Phase 3B: High Protein + Non-Starchy Vegetables  Eat 3-6 small meals/snacks, every 3-5 hrs  Increase lean protein foods to meet 60-85g goal  Increase fluid intake to 64oz +  Avoid carbonated and caffeinated beverages  Avoid drinking 15 minutes before, during and 30 minutes after eating  Aim for >30 min of physical activity daily 

## 2011-08-19 ENCOUNTER — Telehealth: Payer: Self-pay | Admitting: Internal Medicine

## 2011-08-19 NOTE — Telephone Encounter (Signed)
Amoxicillin does not cause a UTI. Is she having vaginal itching? If so, we can call in something for that. What symptoms is she having? Need more information

## 2011-08-20 NOTE — Telephone Encounter (Signed)
Sp w/Kimber @ [facility] and called in Rx per MJB in notes.  Adv pt of this info as well.

## 2011-08-20 NOTE — Telephone Encounter (Signed)
Sp w/pt; she advised that it itches like crazy, no discharge at all.  She just didn't want it to turn into a yeast infection.  She has had no bleeding, no frequency.  Itching is just aggravating her to death.

## 2011-08-20 NOTE — Telephone Encounter (Signed)
Call in Diflucan 150 mg with i refill. Take only one tablet and repeat in 3 days if needed.

## 2011-11-12 ENCOUNTER — Ambulatory Visit: Payer: 59 | Admitting: *Deleted

## 2011-11-20 ENCOUNTER — Ambulatory Visit: Payer: 59 | Admitting: *Deleted

## 2011-11-27 ENCOUNTER — Encounter: Payer: 59 | Attending: Surgery | Admitting: *Deleted

## 2011-11-27 ENCOUNTER — Encounter: Payer: Self-pay | Admitting: *Deleted

## 2011-11-27 VITALS — Ht 64.0 in | Wt 237.0 lb

## 2011-11-27 DIAGNOSIS — E669 Obesity, unspecified: Secondary | ICD-10-CM

## 2011-11-27 DIAGNOSIS — Z713 Dietary counseling and surveillance: Secondary | ICD-10-CM | POA: Insufficient documentation

## 2011-11-27 DIAGNOSIS — Z09 Encounter for follow-up examination after completed treatment for conditions other than malignant neoplasm: Secondary | ICD-10-CM | POA: Insufficient documentation

## 2011-11-27 DIAGNOSIS — Z9884 Bariatric surgery status: Secondary | ICD-10-CM | POA: Insufficient documentation

## 2011-11-27 NOTE — Patient Instructions (Signed)
Goals:  Continue to follow Phase 3B: High Protein + Non-Starchy Vegetables  Eat 3-6 small meals/snacks, every 3-5 hrs  Increase lean protein foods to meet 60-85g goal  Increase fluid intake to 64oz +  Avoid carbonated and caffeinated beverages  Avoid drinking 15 minutes before, during and 30 minutes after eating  Aim for >30 min of physical activity daily

## 2011-11-27 NOTE — Progress Notes (Signed)
  Follow-up visit:  10 Month Post-Operative Gastric Bypass Surgery  Medical Nutrition Therapy:  Appt start time: 1700   End time:  1730.  Primary concerns today: Post-operative bariatric surgery nutrition management.  Kiarrah returns for f/u with additional 15 lb wt loss.  Reports she is in bankruptcy and under great financial stress trying to pay it off.  She receives $200/mo in food stamps, which goes quickly feeding 3 children.  She has been relying on help from friends for her own food, which often comes in the form of fast food.  Has been working overtime to pay bills, though exercise has decreased d/t to this and toe surgery.  Very emotional today.  Surgery date: 01/28/11   Surgery type: Gastric Bypass  Start wt @ [facility]: 320.9 lbs  Weight today: 237.0 lbs Weight change: 15.0 lbs Total weight lost: 83.9 lbs total BMI: 40.7 kg/m^2  Weight goal: 180 lbs % goal met: 60%  TANITA  BODY COMP RESULTS   05/13/11 08/12/11 11/27/11  %Fat 51.1% 48.2% 46.4%  FM (lbs) 137.5 121.5 110.0  FFM (lbs) 131.5 130.5 127.0  TBW (lbs) 96.5 95.5 93.0   24-hr recall: B (9  AM): [employer] chicken biscuit (eats 1/2 bread) L (12 PM):  Applesauce cup, oatmeal 1 pkt Snk (3-4 PM): Granola bar ([place]) D (6-8 PM): Fried pork chop (1/2) w/ greens Snk (PM):  None  Fluid intake: water, crystal light, coffee: 20-25 oz Estimated total protein intake: 50-55 g  Medications: See medication list Supplementation: Taking regularly; No problems reported  Using straws: No Drinking while eating: No Hair loss: No Carbonated beverages: No N/V/D/C: No Dumping syndrome: None reported.  Recent physical activity:  None recently d/t toe surgery  Samples given during visit include:   Premier Protein shake (30g protein): 6 ea Lot # [id number];  Exp: 09/04/12  Progress Towards Goal(s):  In progress.  Nutritional Diagnosis:  [place]-3.3 Overweight/obesity As related to recent RNY Gastric Bypass surgery.  As evidenced by pt  with continued adherence to post-op gastric bypass nutrition dietary recommendations.    Intervention:  Nutrition education.  Monitoring/Evaluation:  Dietary intake, exercise, and body weight. Follow up in 2 months for 12 month post-op visit.

## 2011-12-05 ENCOUNTER — Ambulatory Visit: Payer: 59 | Admitting: Internal Medicine

## 2011-12-10 ENCOUNTER — Encounter: Payer: Self-pay | Admitting: Internal Medicine

## 2011-12-10 ENCOUNTER — Ambulatory Visit (INDEPENDENT_AMBULATORY_CARE_PROVIDER_SITE_OTHER): Payer: 59 | Admitting: Internal Medicine

## 2011-12-10 VITALS — BP 118/80 | HR 60 | Temp 98.4°F | Ht 64.5 in | Wt 230.0 lb

## 2011-12-10 DIAGNOSIS — R001 Bradycardia, unspecified: Secondary | ICD-10-CM

## 2011-12-10 DIAGNOSIS — I498 Other specified cardiac arrhythmias: Secondary | ICD-10-CM

## 2011-12-10 DIAGNOSIS — R002 Palpitations: Secondary | ICD-10-CM

## 2011-12-13 ENCOUNTER — Telehealth: Payer: Self-pay

## 2011-12-13 NOTE — Telephone Encounter (Signed)
Patient scheduled for an appointment with Dr. Sanjuana Kava at [facility] on 12/31/2011 at 3:45pm. Patient aware

## 2011-12-31 ENCOUNTER — Encounter: Payer: Self-pay | Admitting: Cardiovascular Disease

## 2011-12-31 ENCOUNTER — Ambulatory Visit (INDEPENDENT_AMBULATORY_CARE_PROVIDER_SITE_OTHER): Payer: 59 | Admitting: Cardiovascular Disease

## 2011-12-31 VITALS — BP 134/76 | HR 56 | Ht 64.0 in | Wt 230.1 lb

## 2011-12-31 DIAGNOSIS — R002 Palpitations: Secondary | ICD-10-CM

## 2011-12-31 NOTE — Patient Instructions (Addendum)
Your physician recommends that you schedule a follow-up appointment in: 4-5 weeks.   Your physician has requested that you have an echocardiogram. Echocardiography is a painless test that uses sound waves to create images of your heart. It provides your doctor with information about the size and shape of your heart and how well your heart's chambers and valves are working. This procedure takes approximately one hour. There are no restrictions for this procedure.  Your physician has recommended that you wear a holter monitor. Holter monitors are medical devices that record the heart's electrical activity. Doctors most often use these monitors to diagnose arrhythmias. Arrhythmias are problems with the speed or rhythm of the heartbeat. The monitor is a small, portable device. You can wear one while you do your normal daily activities. This is usually used to diagnose what is causing palpitations/syncope (passing out).   

## 2011-12-31 NOTE — Progress Notes (Signed)
History of Present Illness: 46 yo AAF with history of palpitations, obesity, GERD, anxiety, OSA who is here today for evaluation of palpitations. She has been noticing this for one month. Episodes are described as fluttering. No associated chest pain or SOB. These occur at rest when she is anxious. She does get very anxious and tearful at times. No prior cardiac problems. Last episode of fluttering in her upper chest two days ago. This made her feel dizzy.   Primary Care Physician: Megan Salon Baxley  Last Lipid Profile:Lipid Panel     Component Value Date/Time   CHOL 166 10/24/2010 0725   TRIG 46 10/24/2010 0725   HDL 58 10/24/2010 0725   CHOLHDL 2.9 10/24/2010 0725   VLDL 9 10/24/2010 0725   LDLCALC 99 10/24/2010 0725     Past Medical History  Diagnosis Date  . Obesity   . Varicose vein of leg   . Leg pain   . Back pain   . Gallstones   . PONV (postoperative nausea and vomiting) 01-24-11    pt. has PONV with anesthesia occ.  . Sleep apnea 01-24-11    no cpap used  . GERD (gastroesophageal reflux disease) 01-24-11    reflux with hiatal hernia-no regular meds  . Hiatal hernia 01-24-11    hx. of this recent dx./no nerve issues  . Anxiety attack 01-24-11    previoulsy and only once    Past Surgical History  Procedure Date  . Tubal ligation   . Endometrial ablation w/ novasure   . Cholecystectomy 01-24-11    '96-Laparoscopic  . Foot surgery 01-24-11    bilateral  . Gastric roux-en-y 01/28/2011    Procedure: LAPAROSCOPIC ROUX-EN-Y GASTRIC;  Surgeon: Valarie Merino, MD;  Location: [facility];  Service: General;  Laterality: N/A;  With upper endoscopy    Current Outpatient Prescriptions  Medication Sig Dispense Refill  . ALPRAZolam (XANAX) 0.25 MG tablet Take 0.25 mg by mouth as needed.      . calcium citrate-vitamin D 200-200 MG-UNIT TABS Take 5 tablets by mouth daily.      . cyanocobalamin 500 MCG tablet Take 500 mcg by mouth daily.      . meloxicam (MOBIC) 15 MG tablet Take  15 mg by mouth daily as needed.       . Multiple Vitamins-Minerals (MULTIVITAMIN WITH MINERALS) tablet Take 1 tablet by mouth daily.        Allergies  Allergen Reactions  . Amoxicillin     History   Social History  . Marital Status: Divorced    Spouse Name: N/A    Number of Children: 5  . 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 Smoking status: Never Smoker   . Smokeless tobacco: Never Used  . Alcohol Use: 3.5 oz/week    7 drink(s) per week  . Drug Use: No  . Sexually Active: Yes   Other Topics Concern  . Not on file   Social History Narrative  . No narrative on file    Family History  Problem Relation Age of Onset  . Diabetes Mother   . Heart disease Mother     Not sure what type of heart disease  . Hyperlipidemia Father   . Diabetes Brother   . Heart attack Father     Review of Systems:  As stated in the HPI and otherwise negative.   BP 134/76  Pulse  56  Ht 5\' 4"  (1.626 m)  Wt 230 lb 1.9 oz (104.382 kg)  BMI 39.50 kg/m2  Physical Examination: General: Well developed, well nourished, NAD HEENT: OP clear, mucus membranes moist SKIN: warm, dry. No rashes. Neuro: No focal deficits Musculoskeletal: Muscle strength 5/5 all ext Psychiatric: Mood and affect normal Neck: No JVD, no carotid bruits, no thyromegaly, no lymphadenopathy. Lungs:Clear bilaterally, no wheezes, rhonci, crackles Cardiovascular: Regular rate and rhythm. No murmurs, gallops or rubs. Abdomen:Soft. Bowel sounds present. Non-tender.  Extremities: No lower extremity edema. Pulses are 2 + in the bilateral DP/PT.  EKG: Sinus brady, rate 56 bpm.   Assessment and Plan:   1. Palpitations: Likely premature beats. Will arrange 48 hour monitor, echo to exclude structural heart disease. Will check BMET and TSH (TSH was 8.0 in 2012). I do not think an ischemic workup is indicated.

## 2012-01-01 LAB — BASIC METABOLIC PANEL
BUN: 14 mg/dL (ref 6–23)
CO2: 30 mEq/L (ref 19–32)
Chloride: 106 mEq/L (ref 96–112)
Creatinine, Ser: 0.9 mg/dL (ref 0.4–1.2)
Glucose, Bld: 72 mg/dL (ref 70–99)
Potassium: 3.9 mEq/L (ref 3.5–5.1)

## 2012-01-02 LAB — TSH: TSH: 1.12 u[IU]/mL (ref 0.35–5.50)

## 2012-01-09 ENCOUNTER — Ambulatory Visit (HOSPITAL_COMMUNITY): Payer: 59 | Attending: Cardiology | Admitting: Radiology

## 2012-01-09 ENCOUNTER — Encounter (INDEPENDENT_AMBULATORY_CARE_PROVIDER_SITE_OTHER): Payer: 59

## 2012-01-09 DIAGNOSIS — R002 Palpitations: Secondary | ICD-10-CM | POA: Insufficient documentation

## 2012-01-09 DIAGNOSIS — E669 Obesity, unspecified: Secondary | ICD-10-CM | POA: Insufficient documentation

## 2012-01-09 DIAGNOSIS — I517 Cardiomegaly: Secondary | ICD-10-CM | POA: Insufficient documentation

## 2012-01-09 DIAGNOSIS — G4733 Obstructive sleep apnea (adult) (pediatric): Secondary | ICD-10-CM | POA: Insufficient documentation

## 2012-01-09 DIAGNOSIS — F411 Generalized anxiety disorder: Secondary | ICD-10-CM | POA: Insufficient documentation

## 2012-01-09 NOTE — Progress Notes (Signed)
Echocardiogram performed.  

## 2012-01-27 ENCOUNTER — Encounter: Payer: Self-pay | Admitting: *Deleted

## 2012-01-27 ENCOUNTER — Encounter: Payer: 59 | Attending: Surgery | Admitting: *Deleted

## 2012-01-27 VITALS — Ht 64.0 in | Wt 223.0 lb

## 2012-01-27 DIAGNOSIS — E669 Obesity, unspecified: Secondary | ICD-10-CM

## 2012-01-27 DIAGNOSIS — Z9884 Bariatric surgery status: Secondary | ICD-10-CM | POA: Insufficient documentation

## 2012-01-27 DIAGNOSIS — Z713 Dietary counseling and surveillance: Secondary | ICD-10-CM | POA: Insufficient documentation

## 2012-01-27 DIAGNOSIS — Z09 Encounter for follow-up examination after completed treatment for conditions other than malignant neoplasm: Secondary | ICD-10-CM | POA: Insufficient documentation

## 2012-01-27 NOTE — Patient Instructions (Addendum)
Goals:  Continue to follow Phase 3B: High Protein + Non-Starchy Vegetables OR move to Phase 4  Increase lean protein foods to meet 60-80g goal  Increase fluid intake to 64oz +  Aim for >30 min of physical activity daily

## 2012-01-27 NOTE — Progress Notes (Signed)
  Follow-up visit:  12 Month Post-Operative Gastric Bypass Surgery  Medical Nutrition Therapy:  Appt start time: 1600   End time:  1630.  Primary concerns today: Post-operative bariatric surgery nutrition management.  Jenine returns for f/u with additional 14 lb wt loss.  Reports continued stress over finances and recent episode of heart palpitations, but is now doing well.  Still decreased fluid and protein intake. Discussed ways to increase. Urged to start exercising.   Surgery date: 01/28/11   Surgery type: Gastric Bypass  Start wt @ [facility]: 320.9 lbs  Weight today: 223.0 lbs Weight change: 14.0 lbs Total weight lost: 97.9 lbs total BMI: 38.3 kg/m^2  Weight goal: 180 lbs % goal met: 70%  TANITA  BODY COMP RESULTS   05/13/11 08/12/11 11/27/11 01/27/12  %Fat 51.1% 48.2% 46.4% 45.5%  FM (lbs) 137.5 121.5 110.0 101.5  FFM (lbs) 131.5 130.5 127.0 121.5  TBW (lbs) 96.5 95.5 93.0 89.0   24-hr recall: B (9  AM): Yogurt OR applesauce L (12 PM):  Lean cuisine (Meatballs w/ pasta OR [place] rice) D (6-8 PM): 2 oz meat, string beans *No snacks unless 1/3 bag popcorn after dinner  Fluid intake: water, crystal light, coffee: 20-25 oz Estimated total protein intake: 50-55 g  Medications: See medication list Supplementation: Taking regularly; No problems reported  Using straws: No Drinking while eating: No Hair loss: No Carbonated beverages:  A few sips  N/V/D/C:  Mild constipation "once in a blue moon" Dumping syndrome: None reported.  Recent physical activity:  None  Progress Towards Goal(s):  In progress.  Nutritional Diagnosis:  [place]-3.3 Overweight/obesity As related to recent RNY Gastric Bypass surgery.  As evidenced by pt with continued adherence to post-op gastric bypass nutrition dietary recommendations.    Intervention:  Nutrition education.  Monitoring/Evaluation:  Dietary intake, exercise, and body weight. Follow up in 3 months for 15 month post-op visit.

## 2012-02-05 ENCOUNTER — Ambulatory Visit (INDEPENDENT_AMBULATORY_CARE_PROVIDER_SITE_OTHER): Payer: 59 | Admitting: Cardiovascular Disease

## 2012-02-05 ENCOUNTER — Encounter: Payer: Self-pay | Admitting: Cardiovascular Disease

## 2012-02-05 VITALS — BP 116/73 | HR 51 | Resp 18 | Ht 64.0 in | Wt 224.8 lb

## 2012-02-05 DIAGNOSIS — I491 Atrial premature depolarization: Secondary | ICD-10-CM | POA: Insufficient documentation

## 2012-02-05 DIAGNOSIS — R002 Palpitations: Secondary | ICD-10-CM

## 2012-02-05 NOTE — Progress Notes (Signed)
History of Present Illness: 46 yo AAF with history of palpitations, obesity, GERD, anxiety, OSA who is here today for cardiac followup. I saw her last month for evaluation of palpitations. She has been noticing this for one month. Episodes are described as fluttering. No associated chest pain or SOB. These occur at rest when she is anxious. She does get very anxious and tearful at times. No prior cardiac problems. I arranged an echo which showed normal LV size and function with LVEF of 55-60%, mild left atrial enlargement. 48 hour holter monitor with NSR, PACs and PVCs.   She is here today for follow up. She is feeling well. She is having no chest pains or SOB. No awareness of palpitations.   Primary Care Physician: Megan Salon Baxley  Last Lipid Profile:Lipid Panel     Component Value Date/Time   CHOL 166 10/24/2010 0725   TRIG 46 10/24/2010 0725   HDL 58 10/24/2010 0725   CHOLHDL 2.9 10/24/2010 0725   VLDL 9 10/24/2010 0725   LDLCALC 99 10/24/2010 0725     Past Medical History  Diagnosis Date  . Obesity   . Varicose vein of leg   . Leg pain   . Back pain   . Gallstones   . PONV (postoperative nausea and vomiting) 01-24-11    pt. has PONV with anesthesia occ.  . Sleep apnea 01-24-11    no cpap used  . GERD (gastroesophageal reflux disease) 01-24-11    reflux with hiatal hernia-no regular meds  . Hiatal hernia 01-24-11    hx. of this recent dx./no nerve issues  . Anxiety attack 01-24-11    previoulsy and only once    Past Surgical History  Procedure Date  . Tubal ligation   . Endometrial ablation w/ novasure   . Cholecystectomy 01-24-11    '96-Laparoscopic  . Foot surgery 01-24-11    bilateral  . Gastric roux-en-y 01/28/2011    Procedure: LAPAROSCOPIC ROUX-EN-Y GASTRIC;  Surgeon: Valarie Merino, MD;  Location: [facility];  Service: General;  Laterality: N/A;  With upper endoscopy    Current Outpatient Prescriptions  Medication Sig Dispense Refill  . ALPRAZolam (XANAX)  0.25 MG tablet Take 0.25 mg by mouth as needed.      . calcium citrate-vitamin D 200-200 MG-UNIT TABS Take 5 tablets by mouth daily.      . cyanocobalamin 500 MCG tablet Take 500 mcg by mouth daily.      . Multiple Vitamins-Minerals (MULTIVITAMIN WITH MINERALS) tablet Take 1 tablet by mouth 2 (two) times daily.         Allergies  Allergen Reactions  . Amoxicillin     History   Social History  . Marital Status: Divorced    Spouse Name: N/A    Number of Children: 5  . 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 Smoking status: Never Smoker   . Smokeless tobacco: Never Used  . Alcohol Use: 3.5 oz/week    7 drink(s) per week  . Drug Use: No  . Sexually Active: Yes   Other Topics Concern  . Not on file   Social History Narrative  . No narrative on file    Family History  Problem Relation Age of Onset  . Diabetes Mother   . Heart disease Mother     Not sure what type of heart disease  . Hyperlipidemia Father   . Diabetes  Brother   . Heart attack Father     Review of Systems:  As stated in the HPI and otherwise negative.   BP 116/73  Pulse 51  Resp 18  Ht 5\' 4"  (1.626 m)  Wt 224 lb 12.8 oz (101.969 kg)  BMI 38.59 kg/m2  SpO2 98%  Physical Examination: General: Well developed, well nourished, NAD HEENT: OP clear, mucus membranes moist SKIN: warm, dry. No rashes. Neuro: No focal deficits Musculoskeletal: Muscle strength 5/5 all ext Psychiatric: Mood and affect normal Neck: No JVD, no carotid bruits, no thyromegaly, no lymphadenopathy. Lungs:Clear bilaterally, no wheezes, rhonci, crackles Cardiovascular: Regular rate and rhythm. No murmurs, gallops or rubs. Abdomen:Soft. Bowel sounds present. Non-tender.  Extremities: No lower extremity edema. Pulses are 2 + in the bilateral DP/PT.  Echo 01/09/12:  Left ventricle: The cavity size was normal. Wall thickness was normal. Systolic  function was normal. The estimated ejection fraction was in the range of 55% to 60%. Wall motion was normal; there were no regional wall motion abnormalities. Left ventricular diastolic function parameters were normal. - Aortic valve: Trivial regurgitation. - Left atrium: The atrium was mildly dilated.  Assessment and Plan:   1. Palpitations: Likely secondary to PACs/PVCs. Resolved. Avoid stimulants. Call with recurrence and we could consider starting Cardizem.

## 2012-02-07 NOTE — Patient Instructions (Addendum)
We will make cardiology appointment for you. Avoid caffeine.

## 2012-02-07 NOTE — Progress Notes (Signed)
  Subjective:    Patient ID: Emma Gonzales, female    DOB: 09-25-1965, 46 y.o.   MRN: [id number]  HPI 46 year old Black female in today complaining of discomfort in her throat and palpitations. She denies taking decongestants. Is under some financial stress. Has been drinking coffee today. Will receive influenza vaccine at work. She has a history of obesity and is status post gastric bypass surgery for obesity. History of sleep apnea and history of palpitations. She has seen cardiologist before for palpitations. History of anxiety. Has taken Celexa for anxiety depression. She works full-time. No known drug allergies. She works for [employer] here in [place].  She had 2 foot surgeries in 2007. Fractured left arm at age 20 years old. History of obstructive sleep apnea but apparently does not use CPAP.  Family history: Mother with history of MI in her 78s. Brother with diabetes mellitus. She has 5 children. She is divorced. She does not exercise. Has never smoked. Says she has had to file bankruptcy several years ago but is recovering from that. Financial stress worsened when her boyfriend moved out around 2008.  EKG today shows sinus bradycardia with a rate of 52 and occasional PACs.  She was seen at [facility] in 2008 and had 2-D echocardiogram for palpitations which showed the left atrium to be mildly dilated and borderline right atrial enlargement. No evidence of atrial septal defect. She had mild LVH. Systolic function of the left ventricle was normal. She had mild mitral regurgitation and mild tricuspid regurgitation. Sleep study done in [facility] sleep [facility] showed mild to moderate obstructive sleep apnea. Weight loss was recommended. CPAP was recommended but I do not think the patient never got that device. She was referred to [facility] by [facility] physician. At that time she weighed 342 pounds with a BMI of 57.5. She was  given a prescription for metoprolol to take as needed for palpitations.    Review of Systems     Objective:   Physical Exam patient is tearful in the office today. I think she is afraid something is seriously wrong. Neck is supple without thyromegaly JVD or carotid bruits. Chest is clear to auscultation. Cardiac exam rate of cardiac regular rate and rhythm with occasional extrasystole. Skin is warm and dry. Affect is anxious. Extremities without pitting edema.        Assessment & Plan:  Palpitations  Plan: Patient will be referred to cardiologist for further evaluation for reassurance and further testing if necessary. She is to avoid caffeine. She has metoprolol on hand but is bradycardic today with a rate of 52 so I am reluctant to tell her to advise her to take that for palpitations at that heart rate.

## 2012-04-27 ENCOUNTER — Ambulatory Visit: Payer: 59 | Admitting: *Deleted

## 2012-05-26 ENCOUNTER — Ambulatory Visit: Payer: Self-pay | Admitting: Internal Medicine

## 2012-05-26 ENCOUNTER — Telehealth: Payer: Self-pay | Admitting: Internal Medicine

## 2012-05-26 ENCOUNTER — Other Ambulatory Visit: Payer: Self-pay | Admitting: Obstetrics and Gynecology

## 2012-05-26 DIAGNOSIS — R1031 Right lower quadrant pain: Secondary | ICD-10-CM

## 2012-05-26 NOTE — Telephone Encounter (Signed)
Patient called yesterday asking for appointment to be seen regarding right side pain. It was not clear if it was abdominal pain or hip pain. Patient says she saw GYN physician recently who did an ultrasound which turned out to be normal. Office visit was advised today. Patient called today canceling that appointment saying that OB/GYN had ordered CT of abdomen and pelvis and she was going to pursue that. GYN is Dr. Cherly Hensen. I do see an order for CT of the abdomen and pelvis in Epic. Appointment canceled for today at patient request.

## 2012-05-29 ENCOUNTER — Other Ambulatory Visit: Payer: 59

## 2012-06-04 ENCOUNTER — Inpatient Hospital Stay: Admission: RE | Admit: 2012-06-04 | Payer: 59 | Source: Ambulatory Visit

## 2012-06-10 ENCOUNTER — Ambulatory Visit: Payer: 59 | Admitting: Emergency Medicine

## 2012-06-10 ENCOUNTER — Ambulatory Visit: Payer: 59

## 2012-06-10 VITALS — BP 114/64 | HR 55 | Temp 98.8°F | Resp 16 | Ht 64.5 in | Wt 222.0 lb

## 2012-06-10 DIAGNOSIS — M25551 Pain in right hip: Secondary | ICD-10-CM

## 2012-06-10 DIAGNOSIS — M25559 Pain in unspecified hip: Secondary | ICD-10-CM

## 2012-06-10 MED ORDER — CELECOXIB 200 MG PO CAPS: 400.0000 mg | ORAL_CAPSULE | Freq: Two times a day (BID) | ORAL | Status: DC

## 2012-06-10 NOTE — Progress Notes (Signed)
[facility] [address] [place] [address] [phone]  Date:  06/10/2012   Name:  Emma Gonzales   DOB:  30-Nov-1965   MRN:  [id number]  PCP:  [name], MD    Chief Complaint: Hip Pain   History of Present Illness:  Emma Gonzales is a 47 y.o. very pleasant female patient who presents with the following:  1 month duration pain in right hip.  No associated injury.  Denies overuse or history of arthritis.  Says pain is nearly constant and worse with activity.  No fever or chills, ecchymosis or erythema.  No improvement with over the counter medications or other home remedies. Denies other complaint or health concern today.   Patient Active Problem List  Diagnosis  . Obesity  . Palpitations  . History of sleep apnea  . Lap gastric bypass Nov 2012  . PAC (premature atrial contraction)    Past Medical History  Diagnosis Date  . Obesity   . Varicose vein of leg   . Leg pain   . Back pain   . Gallstones   . PONV (postoperative nausea and vomiting) 01-24-11    pt. has PONV with anesthesia occ.  . Sleep apnea 01-24-11    no cpap used  . GERD (gastroesophageal reflux disease) 01-24-11    reflux with hiatal hernia-no regular meds  . Hiatal hernia 01-24-11    hx. of this recent dx./no nerve issues  . Anxiety attack 01-24-11    previoulsy and only once    Past Surgical History  Procedure Laterality Date  . Tubal ligation    . Endometrial ablation w/ novasure    . Cholecystectomy  01-24-11    '96-Laparoscopic  . Foot surgery  01-24-11    bilateral  . Gastric roux-en-y  01/28/2011    Procedure: LAPAROSCOPIC ROUX-EN-Y GASTRIC;  Surgeon: Valarie Merino, MD;  Location: [facility];  Service: General;  Laterality: N/A;  With upper endoscopy    History  Substance Use Topics  . Smoking status: Never Smoker   . Smokeless tobacco: Never Used  . Alcohol Use: 3.5 oz/week    7 drink(s) per week    Family History  Problem Relation Age of Onset  .  Diabetes Mother   . Heart disease Mother     Not sure what type of heart disease  . Hyperlipidemia Father   . Diabetes Brother   . Heart attack Father     Allergies  Allergen Reactions  . Amoxicillin     Medication list has been reviewed and updated.  Current Outpatient Prescriptions on File Prior to Visit  Medication Sig Dispense Refill  . ALPRAZolam (XANAX) 0.25 MG tablet Take 0.25 mg by mouth as needed.      . calcium citrate-vitamin D 200-200 MG-UNIT TABS Take 5 tablets by mouth daily.      . cyanocobalamin 500 MCG tablet Take 500 mcg by mouth daily.      . Multiple Vitamins-Minerals (MULTIVITAMIN WITH MINERALS) tablet Take 1 tablet by mouth 2 (two) times daily.        No current facility-administered medications on file prior to visit.    Review of Systems:  .evi   Physical Examination: Filed Vitals:   06/10/12 1723  BP: 114/64  Pulse: 55  Temp: 98.8 F (37.1 C)  Resp: 16   Filed Vitals:   06/10/12 1723  Height: 5' 4.5" (1.638 m)  Weight: 222 lb (100.699 kg)   Body  mass index is 37.53 kg/(m^2). Ideal Body Weight: Weight in (lb) to have BMI = 25: 147.6   GEN: WDWN, NAD, Non-toxic, Alert & Oriented x 3 HEENT: Atraumatic, Normocephalic.  Ears and Nose: No external deformity. EXTR: No clubbing/cyanosis/edema NEURO: Normal gait.  PSYCH: Normally interactive. Conversant. Not depressed or anxious appearing.  Calm demeanor.  Right HIP:  Tender.  Full PROM  Assessment and Plan: Hip pain celebrex Follow up in two weeks   Signed,  Phillips Odor, MD   [facility] reading (PRIMARY) by  Dr. Dareen Piano. No osseous injury.

## 2012-06-10 NOTE — Patient Instructions (Signed)
Hip Pain  The hips join the upper legs to the lower pelvis. The bones, cartilage, tendons, and muscles of the hip joint perform a lot of work each day holding your body weight and allowing you to move around.  Hip pain is a common symptom. It can range from a minor ache to severe pain on 1 or both hips. Pain may be felt on the inside of the hip joint near the groin, or the outside near the buttocks and upper thigh. There may be swelling or stiffness as well. It occurs more often when a person walks or performs activity. There are many reasons hip pain can develop.  CAUSES   It is important to work with your caregiver to identify the cause since many conditions can impact the bones, cartilage, muscles, and tendons of the hips. Causes for hip pain include:   Broken (fractured) bones.   Separation of the thighbone from the hip socket (dislocation).   Torn cartilage of the hip joint.   Swelling (inflammation) of a tendon (tendonitis), the sac within the hip joint (bursitis), or a joint.   A weakening in the abdominal wall (hernia), affecting the nerves to the hip.   Arthritis in the hip joint or lining of the hip joint.   Pinched nerves in the back, hip, or upper thigh.   A bulging disc in the spine (herniated disc).   Rarely, bone infection or cancer.  DIAGNOSIS   The location of your hip pain will help your caregiver understand what may be causing the pain. A diagnosis is based on your medical history, your symptoms, results from your physical exam, and results from diagnostic tests. Diagnostic tests may include X-ray exams, a computerized magnetic scan (magnetic resonance imaging, MRI), or bone scan.  TREATMENT   Treatment will depend on the cause of your hip pain. Treatment may include:   Limiting activities and resting until symptoms improve.   Crutches or other walking supports (a cane or brace).   Ice, elevation, and compression.   Physical therapy or home exercises.    Shoe inserts or special shoes.   Losing weight.   Medications to reduce pain.   Undergoing surgery.  HOME CARE INSTRUCTIONS    Only take over-the-counter or prescription medicines for pain, discomfort, or fever as directed by your caregiver.   Put ice on the injured area:   Put ice in a plastic bag.   Place a towel between your skin and the bag.   Leave the ice on for 15 to 20 minutes at a time, 3 to 4 times a day.   Keep your leg raised (elevated) when possible to lessen swelling.   Avoid activities that cause pain.   Follow specific exercises as directed by your caregiver.   Sleep with a pillow between your legs on your most comfortable side.   Record how often you have hip pain, the location of the pain, and what it feels like. This information may be helpful to you and your caregiver.   Ask your caregiver about returning to work or sports and whether you should drive.   Follow up with your caregiver for further exams, therapy, or testing as directed.  SEEK MEDICAL CARE IF:    Your pain or swelling continues or worsens after 1 week.   You are feeling unwell or have chills.   You have increasing difficulty with walking.   You have a loss of sensation or other new symptoms.   You have questions   or concerns.  SEEK IMMEDIATE MEDICAL CARE IF:    You cannot put weight on the affected hip.   You have fallen.   You have a sudden increase in pain and swelling in your hip.   You have a fever.  MAKE SURE YOU:    Understand these instructions.   Will watch your condition.   Will get help right away if you are not doing well or get worse.  Document Released: 08/15/2009 Document Revised: 05/20/2011 Document Reviewed: 08/15/2009  ExitCare Patient Information 2013 [employer].

## 2012-06-11 NOTE — Progress Notes (Signed)
Reviewed and agree.

## 2012-06-12 NOTE — Progress Notes (Signed)
Prior auth approved for Celebrex 200 mg through 06/12/13, case # [id number]. Notified pharmacy.

## 2012-06-24 ENCOUNTER — Other Ambulatory Visit (HOSPITAL_COMMUNITY): Payer: Self-pay | Admitting: Obstetrics and Gynecology

## 2012-06-24 DIAGNOSIS — Z1231 Encounter for screening mammogram for malignant neoplasm of breast: Secondary | ICD-10-CM

## 2012-07-02 ENCOUNTER — Encounter: Payer: 59 | Attending: Surgery | Admitting: *Deleted

## 2012-07-02 ENCOUNTER — Ambulatory Visit (HOSPITAL_COMMUNITY)
Admission: RE | Admit: 2012-07-02 | Discharge: 2012-07-02 | Disposition: A | Discharge status: Home / Self Care | Payer: 59 | Source: Ambulatory Visit | Attending: Obstetrics and Gynecology | Admitting: Obstetrics and Gynecology

## 2012-07-02 ENCOUNTER — Encounter: Payer: Self-pay | Admitting: *Deleted

## 2012-07-02 VITALS — Ht 64.0 in | Wt 219.0 lb

## 2012-07-02 DIAGNOSIS — Z9884 Bariatric surgery status: Secondary | ICD-10-CM | POA: Insufficient documentation

## 2012-07-02 DIAGNOSIS — E669 Obesity, unspecified: Secondary | ICD-10-CM

## 2012-07-02 DIAGNOSIS — Z1231 Encounter for screening mammogram for malignant neoplasm of breast: Secondary | ICD-10-CM

## 2012-07-02 NOTE — Patient Instructions (Signed)
Goals:  Continue to follow Phase 3B: High Protein + Non-Starchy Vegetables   Watch carb intake - keep to 15g per meal/snack  Increase lean protein foods to meet 60-80g goal  Increase fluid intake to 64oz +  Aim for >30 min of physical activity daily

## 2012-07-02 NOTE — Progress Notes (Signed)
  Follow-up visit:  16 Month Post-Operative Gastric Bypass Surgery  Medical Nutrition Therapy:  Appt start time: 1630   End time:  1700.  Primary concerns today: Post-operative bariatric surgery nutrition management.  Emma Gonzales returns for f/u with continued stress over finances and new car troubles.  Still decreased fluid and protein intake. Discussed ways to increase. Urged again to increase exercise and watch carb intake.   Surgery date: 01/28/11   Surgery type: Gastric Bypass  Start wt @ [facility]: 320.9 lbs  Weight today: 219.0 lbs Weight change: 4.5 lbs Total weight lost: 101.9 lbs total BMI: 37.6 kg/m^2  Weight goal: 170-180 lbs % goal met: 72%  TANITA  BODY COMP RESULTS   05/13/11 08/12/11 11/27/11 01/27/12 07/02/12  %Fat 51.1% 48.2% 46.4% 45.5% 44.2%  FM (lbs) 137.5 121.5 110.0 101.5 97.0  FFM (lbs) 131.5 130.5 127.0 121.5 122.0  TBW (lbs) 96.5 95.5 93.0 89.0 89.5   24-hr recall: B (9  AM): (6 pk) peanut butter nabs OR 1 pkt oatmeal L (12 PM):  Lean cuisine (Chicken Alfredo OR [place] rice) S: Spoonful of peanut butter (regular) D (6-8 PM): 3 oz pork chops, mixed veggies or string beans  Fluid intake: water, crystal light, decaf coffee, pro shake (3x/week): 20-40 oz; some days 60 oz Estimated total protein intake: 50-55 g  Medications: See medication list Supplementation: Taking regularly; No problems reported  Using straws: No Drinking while eating: No Hair loss: No Carbonated beverages:  No  N/V/D/C:  No Dumping syndrome: None reported.  Recent physical activity:  Walks 2 times/week for 20 min.   Progress Towards Goal(s):  In progress.  Nutritional Diagnosis:  [place]-3.3 Overweight/obesity As related to recent RNY Gastric Bypass surgery.  As evidenced by pt with continued adherence to post-op gastric bypass nutrition dietary recommendations.    Intervention:  Nutrition education.  Monitoring/Evaluation:  Dietary intake, exercise, and body weight. Follow up in 3 months  for 19 month post-op visit.

## 2012-07-09 ENCOUNTER — Other Ambulatory Visit: Payer: Self-pay | Admitting: Obstetrics and Gynecology

## 2012-07-10 ENCOUNTER — Other Ambulatory Visit: Payer: Self-pay | Admitting: Obstetrics and Gynecology

## 2012-07-10 ENCOUNTER — Ambulatory Visit
Admission: RE | Admit: 2012-07-10 | Discharge: 2012-07-10 | Disposition: A | Discharge status: Home / Self Care | Payer: 59 | Source: Ambulatory Visit | Attending: Obstetrics and Gynecology | Admitting: Obstetrics and Gynecology

## 2012-07-10 DIAGNOSIS — R1031 Right lower quadrant pain: Secondary | ICD-10-CM

## 2012-07-10 MED ORDER — IOHEXOL 300 MG/ML  SOLN
125.0000 mL | Freq: Once | INTRAMUSCULAR | Status: AC | PRN
  Administered 2012-07-10: 125 mL via INTRAVENOUS

## 2012-07-17 ENCOUNTER — Telehealth (INDEPENDENT_AMBULATORY_CARE_PROVIDER_SITE_OTHER): Payer: Self-pay

## 2012-07-17 NOTE — Telephone Encounter (Signed)
Pt has a hernia and asked what she can take for it.  I told her Tylenol or Advil and she can wear a girdle or some type of compression garment to help hold it in.  She has an appointment 5/29 with Dr Daphine Deutscher.

## 2012-08-06 ENCOUNTER — Encounter (INDEPENDENT_AMBULATORY_CARE_PROVIDER_SITE_OTHER): Payer: Self-pay | Admitting: Surgery

## 2012-08-06 ENCOUNTER — Ambulatory Visit (INDEPENDENT_AMBULATORY_CARE_PROVIDER_SITE_OTHER): Payer: 59 | Admitting: Surgery

## 2012-08-06 VITALS — BP 128/74 | HR 76 | Temp 97.1°F | Resp 16 | Ht 64.0 in | Wt 219.0 lb

## 2012-08-06 DIAGNOSIS — E669 Obesity, unspecified: Secondary | ICD-10-CM

## 2012-08-06 DIAGNOSIS — Z6837 Body mass index (BMI) 37.0-37.9, adult: Secondary | ICD-10-CM

## 2012-08-06 LAB — CBC WITH DIFFERENTIAL/PLATELET
Basophils Absolute: 0 10*3/uL (ref 0.0–0.1)
Basophils Relative: 0 % (ref 0–1)
Eosinophils Relative: 0 % (ref 0–5)
Lymphocytes Relative: 47 % — ABNORMAL HIGH (ref 12–46)
MCHC: 33.3 g/dL (ref 30.0–36.0)
MCV: 87.6 fL (ref 78.0–100.0)
Monocytes Absolute: 0.3 10*3/uL (ref 0.1–1.0)
Neutro Abs: 2.1 10*3/uL (ref 1.7–7.7)
Platelets: 218 10*3/uL (ref 150–400)
RDW: 14.7 % (ref 11.5–15.5)
WBC: 4.5 10*3/uL (ref 4.0–10.5)

## 2012-08-06 LAB — TSH: TSH: 0.784 u[IU]/mL (ref 0.350–4.500)

## 2012-08-06 LAB — VITAMIN D 25 HYDROXY (VIT D DEFICIENCY, FRACTURES): Vit D, 25-Hydroxy: 34 ng/mL (ref 30–89)

## 2012-08-06 LAB — FOLATE: Folate: >20 ng/mL

## 2012-08-06 LAB — MAGNESIUM: Magnesium: 1.9 mg/dL (ref 1.5–2.5)

## 2012-08-06 LAB — VITAMIN B12: Vitamin B-12: 697 pg/mL (ref 211–911)

## 2012-08-06 NOTE — Progress Notes (Addendum)
Emma Gonzales 47 y.o.  Body mass index is 37.57 kg/(m^2).  Patient Active Problem List   Diagnosis Date Noted  . PAC (premature atrial contraction) 02/05/2012  . Lap gastric bypass Nov 2012 06/19/2011  . Palpitations 06/14/2011  . History of sleep apnea 06/14/2011  . Obesity 10/17/2010    Allergies  Allergen Reactions  . Amoxicillin     Past Surgical History  Procedure Laterality Date  . Tubal ligation    . Endometrial ablation w/ novasure    . Cholecystectomy  01-24-11    '96-Laparoscopic  . Foot surgery  01-24-11    bilateral  . Gastric roux-en-y  01/28/2011    Procedure: LAPAROSCOPIC ROUX-EN-Y GASTRIC;  Surgeon: Emma Merino, MD;  Location: [facility];  Service: General;  Laterality: N/A;  With upper endoscopy   Emma Mackintosh, MD 1. Obesity     Two years out from lap roux y gastric bypass.  She has lost 106 pounds since her surgery. She does have some redundant skin and it remains to be seen if she develops maceration and breakdown. If that is the case then she would be a candidate for a panniculectomy. More poorly she's had some recurrent attacks of right-sided abdominal pain. She did have a tubal ligation was done open the midline incision. CT scan showed a small hiatal hernia and it did show an umbilical hernia that contained fat and no bowel. On physical exam I cannot feel an umbilical hernia.  She has a lot of abdominal wall redundancy since her weight loss.    The right-sided abdominal pain can be cramping and can last 5 minutes apart in last longer. It is not associated with nausea vomiting. I told her if it recurs and is persistent we probably should get an upper GI series with small bowel follow-through to make certain she's not having some form of an internal hernia that is causing her to have this pain. Although her symptoms are classic she has lost a large amount of weight and could have a defect inside her abdomen.  I'll see her again in one year in routine  followup. If she has recurrent pain I would be happy to see her again sooner and assist in that workup. Emma B. Daphine Deutscher, MD, [facility] [phone] beeper [phone]  08/06/2012 9:21 AM  She has not had her labs checked in a while.  Will obtain vitamin levels and basic postop bariatric labs.

## 2012-08-06 NOTE — Addendum Note (Signed)
Addended by: Eden Lathe K on: 08/06/2012 09:30 AM   Modules accepted: Orders

## 2012-08-07 LAB — IBC PANEL: UIBC: 149 ug/dL (ref 125–400)

## 2012-08-07 LAB — LIPID PANEL
LDL Cholesterol: 61 mg/dL (ref 0–99)
Total CHOL/HDL Ratio: 2.1 Ratio
VLDL: 11 mg/dL (ref 0–40)

## 2012-09-17 ENCOUNTER — Telehealth (INDEPENDENT_AMBULATORY_CARE_PROVIDER_SITE_OTHER): Payer: Self-pay | Admitting: General Surgery

## 2012-09-17 NOTE — Telephone Encounter (Signed)
Having left sided abd pain.  Denies fevers, nausea or vomiting.  Passing flatus, no diarrhea.  We discussed common causes of left sided pain. Will try some miralax.  Will call back if pain persists.

## 2012-10-01 ENCOUNTER — Ambulatory Visit: Payer: 59 | Admitting: *Deleted

## 2012-10-14 ENCOUNTER — Encounter: Payer: Self-pay | Admitting: *Deleted

## 2012-10-14 ENCOUNTER — Encounter: Payer: 59 | Attending: Surgery | Admitting: *Deleted

## 2012-10-14 VITALS — Ht 64.0 in | Wt 219.5 lb

## 2012-10-14 DIAGNOSIS — E669 Obesity, unspecified: Secondary | ICD-10-CM

## 2012-10-14 DIAGNOSIS — Z713 Dietary counseling and surveillance: Secondary | ICD-10-CM | POA: Insufficient documentation

## 2012-10-14 DIAGNOSIS — Z9884 Bariatric surgery status: Secondary | ICD-10-CM | POA: Insufficient documentation

## 2012-10-14 DIAGNOSIS — Z09 Encounter for follow-up examination after completed treatment for conditions other than malignant neoplasm: Secondary | ICD-10-CM | POA: Insufficient documentation

## 2012-10-14 NOTE — Progress Notes (Addendum)
  Follow-up visit:  19 Month Post-Operative Gastric Bypass Surgery  Medical Nutrition Therapy:  Appt start time: 1600   End time:  1630.  Primary concerns today: Post-operative bariatric surgery nutrition management.  Rahaf returns for f/u with continued stress over finances and the recent death of her brother. Reports "its expensive to eat healthy".  Still decreased fluid and protein intake. Discussed ways to increase. Urged again to increase exercise and watch carb intake. Reports pain at site of umbilical hernia and increased reflux from hiatal hernia. Has appt with Dr. Daphine Deutscher to discuss how to proceed with possible hernia surgery.  Surgery date: 01/28/11   Surgery type: Gastric Bypass  Start wt @ [facility]: 320.9 lbs  Weight today: 219.5 lbs Weight change: 0.5 lbs (GAIN) Total weight lost: 101.4 lbs total BMI: 37.7 kg/m^2  Weight goal: 170-180 lbs % goal met: 72%  TANITA  BODY COMP RESULTS   05/13/11 08/12/11 11/27/11 01/27/12 07/02/12 10/14/12  %Fat 51.1% 48.2% 46.4% 45.5% 44.2% 40.7%  FM (lbs) 137.5 121.5 110.0 101.5 97.0 89.5  FFM (lbs) 131.5 130.5 127.0 121.5 122.0 130.0  TBW (lbs) 96.5 95.5 93.0 89.0 89.5 95.0   24-hr recall: B (9  AM):  1 pkt oatmeal OR regular yogurt - 0-5g S (AM): 1 tbsp Peanut butter - 3g L (12 PM):  [place] (Ham & Cheese or Steak & Cheese) - 10-15g S: Spoonful of peanut butter (regular) - 3g D (6-8 PM): 3 oz lean protein, broccoli salad - 20g S (PM): NONE  Fluid intake: water, caffeinated coffee: 50-60 oz Estimated total protein intake: 35-45g  Medications: See medication list Supplementation: Taking regularly; No problems reported  Using straws: No Drinking while eating: No Hair loss: No Carbonated beverages:  No  N/V/D/C:  No Dumping syndrome: None reported.  Recent physical activity:  Walks 2 times/week for 20 min.   Progress Towards Goal(s):  In progress.  Nutritional Diagnosis:  [place]-3.3 Overweight/obesity As related to recent RNY Gastric  Bypass surgery.  As evidenced by pt with continued adherence to post-op gastric bypass nutrition dietary recommendations.    Intervention:  Nutrition education.  Samples given during visit include:   Premier Protein Shake: 7 bottles Lot: [id number]; Exp: 03/23/13  Monitoring/Evaluation:  Dietary intake, exercise, and body weight. Follow up in 3-4 months for 24 month post-op visit.

## 2012-10-14 NOTE — Patient Instructions (Addendum)
Goals:  Continue to follow Phase 3B: High Protein + Non-Starchy Vegetables   Watch carb intake - keep to 15g per meal/snack  Increase lean protein foods to meet 60-80g goal - will help heal from hernia surgery  Increase fluid intake to 64oz +  Aim for >30 min of physical activity daily  PB2 (powdered peanut butter) @ [employer] and [employer] (w/ regular peanut butter)

## 2012-10-15 ENCOUNTER — Ambulatory Visit (INDEPENDENT_AMBULATORY_CARE_PROVIDER_SITE_OTHER): Payer: 59 | Admitting: Surgery

## 2012-10-15 ENCOUNTER — Telehealth (INDEPENDENT_AMBULATORY_CARE_PROVIDER_SITE_OTHER): Payer: Self-pay | Admitting: Surgery

## 2012-10-15 ENCOUNTER — Other Ambulatory Visit (INDEPENDENT_AMBULATORY_CARE_PROVIDER_SITE_OTHER): Payer: Self-pay | Admitting: Surgery

## 2012-10-15 ENCOUNTER — Encounter (INDEPENDENT_AMBULATORY_CARE_PROVIDER_SITE_OTHER): Payer: Self-pay | Admitting: Surgery

## 2012-10-15 VITALS — BP 118/72 | HR 60 | Resp 14 | Ht 64.5 in | Wt 216.2 lb

## 2012-10-15 DIAGNOSIS — R109 Unspecified abdominal pain: Secondary | ICD-10-CM

## 2012-10-15 NOTE — Progress Notes (Signed)
Chief Complaint:  Intermittent cramping abdominal pain and umbilical hernia  History of Present Illness:  Emma Gonzales is an 47 y.o. female who has lost 110 pounds since her Roux-en-Y gastric bypass.over the last year she's had intermittent crampy abdominal pain on the right side. At one point this lasted a couple weeks and she had a CT scan which noted her umbilical hernia and saw no other abnormalities.  This has persisted off and on and yesterday she had some right-sided abdominal cramping pain that abated.  I think that she is put up with this pain and would like to see if there is anything that can be done. I think it would be worth scheduling for laparoscopic assisted umbilical hernia repair and to do a laparoscopic enteral lysis necessary and also to look for evidence of an internal hernia.  Past Medical History  Diagnosis Date  . Obesity   . Varicose vein of leg   . Leg pain   . Back pain   . Gallstones   . PONV (postoperative nausea and vomiting) 01-24-11    pt. has PONV with anesthesia occ.  . Sleep apnea 01-24-11    no cpap used  . GERD (gastroesophageal reflux disease) 01-24-11    reflux with hiatal hernia-no regular meds  . Hiatal hernia 01-24-11    hx. of this recent dx./no nerve issues  . Anxiety attack 01-24-11    previoulsy and only once    Past Surgical History  Procedure Laterality Date  . Tubal ligation    . Endometrial ablation w/ novasure    . Cholecystectomy  01-24-11    '96-Laparoscopic  . Foot surgery  01-24-11    bilateral  . Gastric roux-en-y  01/28/2011    Procedure: LAPAROSCOPIC ROUX-EN-Y GASTRIC;  Surgeon: Valarie Merino, MD;  Location: [facility];  Service: General;  Laterality: N/A;  With upper endoscopy    Current Outpatient Prescriptions  Medication Sig Dispense Refill  . ALPRAZolam (XANAX) 0.25 MG tablet Take 0.25 mg by mouth as needed.      . calcium citrate-vitamin D 200-200 MG-UNIT TABS Take 5 tablets by mouth daily.      . Multiple  Vitamins-Minerals (MULTIVITAMIN WITH MINERALS) tablet Take 1 tablet by mouth 2 (two) times daily.        No current facility-administered medications for this visit.   Amoxicillin Family History  Problem Relation Age of Onset  . Diabetes Mother   . Heart disease Mother     Not sure what type of heart disease  . Hyperlipidemia Father   . Diabetes Brother   . Heart attack Father    Social History:   reports that she has never smoked. She has never used smokeless tobacco. She reports that she drinks about 3.5 ounces of alcohol per week. She reports that she does not use illicit drugs.   REVIEW OF SYSTEMS - PERTINENT POSITIVES ONLY: No DVT. Other systems are negative  Physical Exam:   Blood pressure 118/72, pulse 60, resp. rate 14, height 5' 4.5" (1.638 m), weight 216 lb 3.2 oz (98.068 kg). Body mass index is 36.55 kg/(m^2).  Gen:  WDWN African American female NAD  Neurological: Alert and oriented to person, place, and time. Motor and sensory function is grossly intact  Head: Normocephalic and atraumatic.  Eyes: Conjunctivae are normal. Pupils are equal, round, and reactive to light. No scleral icterus.  Neck: Normal range of motion. Neck supple. No tracheal deviation or thyromegaly present.  Cardiovascular:  SR without  murmurs or gallops.  No carotid bruits Respiratory: Effort normal.  No respiratory distress. No chest wall tenderness. Breath sounds normal.  No wheezes, rales or rhonchi.  Abdomen:  Redundant skin and no incarceration of the umbilical hernia. No rebound or guarding or tenderness at this time. GU: Musculoskeletal: Normal range of motion. Extremities are nontender. No cyanosis, edema or clubbing noted Lymphadenopathy: No cervical, preauricular, postauricular or axillary adenopathy is present Skin: Skin is warm and dry. No rash noted. No diaphoresis. No erythema. No pallor. Pscyh: Normal mood and affect. Behavior is normal. Judgment and thought content normal.    LABORATORY RESULTS: No results found for this or any previous visit (from the past 48 hour(s)).  RADIOLOGY RESULTS: No results found.  Problem List: Patient Active Problem List   Diagnosis Date Noted  . PAC (premature atrial contraction) 02/05/2012  . Lap gastric bypass Nov 2012 06/19/2011  . Palpitations 06/14/2011  . History of sleep apnea 06/14/2011  . Obesity 10/17/2010    Assessment & Plan: Concern for possible internal hernia versus intermittent obstruction in umbilical hernia. Plan laparoscopic exploration with repair of umbilical hernia and search for internal hernia.    [name], MD, [facility] [phone] beeper [phone]  10/15/2012 9:57 AM

## 2012-10-15 NOTE — Patient Instructions (Signed)

## 2012-10-15 NOTE — Telephone Encounter (Signed)
Met with surgery scheduling went over financial responsibilities, patient will call back to schedule, orders held in pending.

## 2012-12-14 ENCOUNTER — Telehealth (INDEPENDENT_AMBULATORY_CARE_PROVIDER_SITE_OTHER): Payer: Self-pay

## 2012-12-14 NOTE — Telephone Encounter (Signed)
Patient is asking does she need be on a liquid diet. Per Dr. Daphine Deutscher she is to start a low carb liquid diet 2 weeks prior her surgery and to stop ALL ANSAID'S one before surgery . Patient verbalized understanding

## 2012-12-15 ENCOUNTER — Encounter (HOSPITAL_COMMUNITY): Payer: Self-pay | Admitting: Pharmacy Technician

## 2012-12-21 ENCOUNTER — Encounter (HOSPITAL_COMMUNITY)
Admission: RE | Admit: 2012-12-21 | Discharge: 2012-12-21 | Disposition: A | Discharge status: Home / Self Care | Payer: 59 | Source: Ambulatory Visit | Attending: Surgery | Admitting: Surgery

## 2012-12-21 ENCOUNTER — Encounter (HOSPITAL_COMMUNITY): Payer: Self-pay

## 2012-12-21 LAB — HCG, SERUM, QUALITATIVE: Preg, Serum: NEGATIVE

## 2012-12-21 LAB — CBC
HCT: 41.1 % (ref 36.0–46.0)
Platelets: 201 10*3/uL (ref 150–400)
RDW: 13.1 % (ref 11.5–15.5)
WBC: 4.5 10*3/uL (ref 4.0–10.5)

## 2012-12-21 NOTE — Patient Instructions (Addendum)
TARIYAH PENDRY  12/21/2012                           YOUR PROCEDURE IS SCHEDULED ON:  12/23/12               PLEASE REPORT TO SHORT STAY CENTER AT :  6:00 AM               CALL THIS NUMBER IF ANY PROBLEMS THE DAY OF SURGERY :               [phone]                      REMEMBER:   Do not eat food or drink liquids AFTER MIDNIGHT   Take these medicines the morning of surgery with A SIP OF WATER: MAY TAKE ALPRAZOLAM IF NEEDED   Do not wear jewelry, make-up   Do not wear lotions, powders, or perfumes.   Do not shave legs or underarms 12 hrs. before surgery (men may shave face)  Do not bring valuables to the hospital.  Contacts, dentures or bridgework may not be worn into surgery.  Leave suitcase in the car. After surgery it may be brought to your room.  For patients admitted to the hospital more than one night, checkout time is 11:00                          The day of discharge.   Patients discharged the day of surgery will not be allowed to drive home                             If going home same day of surgery, must have someone stay with you first                           24 hrs at home and arrange for some one to drive you home from hospital.    Special Instructions:   Please read over the following fact sheets that you were given:               1. DISCONTINUE ASPIRIN AND HERBAL MEDS 5 DAYS PREOP                      2. [place] PREPARING FOR SURGERY SHEET               3. USE FLEET ENEMA THE NIGHT BEFORE SURGERY                                                X_____________________________________________________________________        Failure to follow these instructions may result in cancellation of your surgery

## 2012-12-23 ENCOUNTER — Ambulatory Visit (HOSPITAL_COMMUNITY): Payer: 59 | Admitting: Anesthesiology

## 2012-12-23 ENCOUNTER — Encounter (HOSPITAL_COMMUNITY): Payer: 59 | Admitting: Anesthesiology

## 2012-12-23 ENCOUNTER — Ambulatory Visit (HOSPITAL_COMMUNITY)
Admission: RE | Admit: 2012-12-23 | Discharge: 2012-12-23 | Disposition: A | Discharge status: Home / Self Care | Payer: 59 | Source: Ambulatory Visit | Attending: Surgery | Admitting: Surgery

## 2012-12-23 ENCOUNTER — Encounter (HOSPITAL_COMMUNITY): Payer: Self-pay | Admitting: *Deleted

## 2012-12-23 ENCOUNTER — Encounter (HOSPITAL_COMMUNITY)
Admission: RE | Disposition: A | Discharge status: Home / Self Care | Payer: Self-pay | Source: Ambulatory Visit | Attending: Surgery

## 2012-12-23 DIAGNOSIS — E669 Obesity, unspecified: Secondary | ICD-10-CM | POA: Insufficient documentation

## 2012-12-23 DIAGNOSIS — K219 Gastro-esophageal reflux disease without esophagitis: Secondary | ICD-10-CM | POA: Insufficient documentation

## 2012-12-23 DIAGNOSIS — Z434 Encounter for attention to other artificial openings of digestive tract: Secondary | ICD-10-CM | POA: Insufficient documentation

## 2012-12-23 DIAGNOSIS — Z9884 Bariatric surgery status: Secondary | ICD-10-CM | POA: Insufficient documentation

## 2012-12-23 DIAGNOSIS — I491 Atrial premature depolarization: Secondary | ICD-10-CM | POA: Insufficient documentation

## 2012-12-23 DIAGNOSIS — K469 Unspecified abdominal hernia without obstruction or gangrene: Secondary | ICD-10-CM

## 2012-12-23 DIAGNOSIS — K458 Other specified abdominal hernia without obstruction or gangrene: Secondary | ICD-10-CM | POA: Insufficient documentation

## 2012-12-23 DIAGNOSIS — R002 Palpitations: Secondary | ICD-10-CM | POA: Insufficient documentation

## 2012-12-23 DIAGNOSIS — K420 Umbilical hernia with obstruction, without gangrene: Secondary | ICD-10-CM

## 2012-12-23 DIAGNOSIS — G4730 Sleep apnea, unspecified: Secondary | ICD-10-CM | POA: Insufficient documentation

## 2012-12-23 HISTORY — PX: UMBILICAL HERNIA REPAIR: SHX196

## 2012-12-23 SURGERY — REPAIR, HERNIA, UMBILICAL, LAPAROSCOPIC
Anesthesia: General | Wound class: Clean

## 2012-12-23 MED ORDER — SODIUM CHLORIDE 0.9 % IV SOLN: 250.0000 mL | INTRAVENOUS | Status: DC | PRN

## 2012-12-23 MED ORDER — FENTANYL CITRATE 0.05 MG/ML IJ SOLN: 25.0000 ug | INTRAMUSCULAR | Status: DC | PRN

## 2012-12-23 MED ORDER — ROCURONIUM BROMIDE 100 MG/10ML IV SOLN
INTRAVENOUS | Status: DC | PRN
  Administered 2012-12-23: 40 mg via INTRAVENOUS

## 2012-12-23 MED ORDER — KETOROLAC TROMETHAMINE 30 MG/ML IJ SOLN: 15.0000 mg | Freq: Once | INTRAMUSCULAR | Status: DC | PRN

## 2012-12-23 MED ORDER — SUCCINYLCHOLINE CHLORIDE 20 MG/ML IJ SOLN
INTRAMUSCULAR | Status: DC | PRN
  Administered 2012-12-23: 100 mg via INTRAVENOUS

## 2012-12-23 MED ORDER — 0.9 % SODIUM CHLORIDE (POUR BTL) OPTIME
TOPICAL | Status: DC | PRN
  Administered 2012-12-23: 1000 mL

## 2012-12-23 MED ORDER — ONDANSETRON HCL 4 MG/2ML IJ SOLN: 4.0000 mg | Freq: Four times a day (QID) | INTRAMUSCULAR | Status: DC | PRN

## 2012-12-23 MED ORDER — CHLORHEXIDINE GLUCONATE 4 % EX LIQD
1.0000 "application " | Freq: Once | CUTANEOUS | Status: DC
  Filled 2012-12-23: qty 15

## 2012-12-23 MED ORDER — KCL IN DEXTROSE-NACL 20-5-0.45 MEQ/L-%-% IV SOLN: INTRAVENOUS | Status: DC

## 2012-12-23 MED ORDER — ACETAMINOPHEN 325 MG PO TABS: 650.0000 mg | ORAL_TABLET | ORAL | Status: DC | PRN

## 2012-12-23 MED ORDER — SODIUM CHLORIDE 0.9 % IJ SOLN: 3.0000 mL | Freq: Two times a day (BID) | INTRAMUSCULAR | Status: DC

## 2012-12-23 MED ORDER — PROMETHAZINE HCL 25 MG/ML IJ SOLN: 6.2500 mg | INTRAMUSCULAR | Status: DC | PRN

## 2012-12-23 MED ORDER — BUPIVACAINE LIPOSOME 1.3 % IJ SUSP
INTRAMUSCULAR | Status: DC | PRN
  Administered 2012-12-23: 20 mL

## 2012-12-23 MED ORDER — HEPARIN SODIUM (PORCINE) 5000 UNIT/ML IJ SOLN
5000.0000 [IU] | Freq: Once | INTRAMUSCULAR | Status: AC
  Administered 2012-12-23: 5000 [IU] via SUBCUTANEOUS
  Filled 2012-12-23: qty 1

## 2012-12-23 MED ORDER — LACTATED RINGERS IV SOLN
INTRAVENOUS | Status: DC | PRN
  Administered 2012-12-23 (×2): via INTRAVENOUS

## 2012-12-23 MED ORDER — FENTANYL CITRATE 0.05 MG/ML IJ SOLN
INTRAMUSCULAR | Status: DC | PRN
  Administered 2012-12-23: 100 ug via INTRAVENOUS
  Administered 2012-12-23: 150 ug via INTRAVENOUS

## 2012-12-23 MED ORDER — PROPOFOL 10 MG/ML IV BOLUS
INTRAVENOUS | Status: DC | PRN
  Administered 2012-12-23: 150 mg via INTRAVENOUS

## 2012-12-23 MED ORDER — MORPHINE SULFATE 10 MG/ML IJ SOLN: 1.0000 mg | INTRAMUSCULAR | Status: DC | PRN

## 2012-12-23 MED ORDER — ACETAMINOPHEN 650 MG RE SUPP
650.0000 mg | RECTAL | Status: DC | PRN
  Filled 2012-12-23: qty 1

## 2012-12-23 MED ORDER — OXYCODONE HCL 5 MG PO TABS: 5.0000 mg | ORAL_TABLET | ORAL | Status: DC | PRN

## 2012-12-23 MED ORDER — DEXTROSE 5 % IV SOLN
INTRAVENOUS | Status: AC
  Filled 2012-12-23 (×2): qty 1

## 2012-12-23 MED ORDER — DEXAMETHASONE SODIUM PHOSPHATE 10 MG/ML IJ SOLN
INTRAMUSCULAR | Status: DC | PRN
  Administered 2012-12-23: 10 mg via INTRAVENOUS

## 2012-12-23 MED ORDER — DEXTROSE 5 % IV SOLN
2.0000 g | INTRAVENOUS | Status: AC
  Administered 2012-12-23: 2 g via INTRAVENOUS
  Filled 2012-12-23: qty 2

## 2012-12-23 MED ORDER — ONDANSETRON HCL 4 MG/2ML IJ SOLN
INTRAMUSCULAR | Status: DC | PRN
  Administered 2012-12-23: 4 mg via INTRAMUSCULAR

## 2012-12-23 MED ORDER — SODIUM CHLORIDE 0.9 % IJ SOLN: 3.0000 mL | INTRAMUSCULAR | Status: DC | PRN

## 2012-12-23 MED ORDER — MIDAZOLAM HCL 5 MG/5ML IJ SOLN
INTRAMUSCULAR | Status: DC | PRN
  Administered 2012-12-23: 2 mg via INTRAVENOUS

## 2012-12-23 MED ORDER — GLYCOPYRROLATE 0.2 MG/ML IJ SOLN
INTRAMUSCULAR | Status: DC | PRN
  Administered 2012-12-23: 0.6 mg via INTRAVENOUS

## 2012-12-23 MED ORDER — FLEET ENEMA 7-19 GM/118ML RE ENEM: 1.0000 | ENEMA | Freq: Once | RECTAL | Status: DC

## 2012-12-23 MED ORDER — LIDOCAINE HCL (CARDIAC) 20 MG/ML IV SOLN
INTRAVENOUS | Status: DC | PRN
  Administered 2012-12-23: 50 mg via INTRAVENOUS

## 2012-12-23 MED ORDER — HYDROCODONE-ACETAMINOPHEN 5-325 MG PO TABS: 1.0000 | ORAL_TABLET | ORAL | Status: DC | PRN

## 2012-12-23 MED ORDER — ACETAMINOPHEN 10 MG/ML IV SOLN
1000.0000 mg | Freq: Once | INTRAVENOUS | Status: AC
  Administered 2012-12-23: 1000 mg via INTRAVENOUS
  Filled 2012-12-23 (×2): qty 100

## 2012-12-23 MED ORDER — BUPIVACAINE LIPOSOME 1.3 % IJ SUSP
20.0000 mL | Freq: Once | INTRAMUSCULAR | Status: DC
  Filled 2012-12-23: qty 20

## 2012-12-23 MED ORDER — NEOSTIGMINE METHYLSULFATE 1 MG/ML IJ SOLN
INTRAMUSCULAR | Status: DC | PRN
  Administered 2012-12-23: 4 mg via INTRAVENOUS

## 2012-12-23 SURGICAL SUPPLY — 42 items
ADH SKN CLS APL DERMABOND .7 (GAUZE/BANDAGES/DRESSINGS) ×1
BINDER ABD UNIV 12 45-62 (WOUND CARE) IMPLANT
BINDER ABDOMINAL 46IN 62IN (WOUND CARE)
CANISTER SUCTION 2500CC (MISCELLANEOUS) ×2 IMPLANT
DECANTER SPIKE VIAL GLASS SM (MISCELLANEOUS) IMPLANT
DERMABOND ADVANCED (GAUZE/BANDAGES/DRESSINGS) ×1
DERMABOND ADVANCED .7 DNX12 (GAUZE/BANDAGES/DRESSINGS) ×1 IMPLANT
DEVICE SECURE STRAP 25 ABSORB (INSTRUMENTS) IMPLANT
DEVICE SUTURE ENDOST 10MM (ENDOMECHANICALS) ×2 IMPLANT
DEVICE TROCAR PUNCTURE CLOSURE (ENDOMECHANICALS) IMPLANT
DISSECTOR BLUNT TIP ENDO 5MM (MISCELLANEOUS) IMPLANT
DRAIN CHANNEL 19F RND (DRAIN) IMPLANT
DRAPE LAPAROSCOPIC ABDOMINAL (DRAPES) ×2 IMPLANT
ELECT REM PT RETURN 9FT ADLT (ELECTROSURGICAL) ×2
ELECTRODE REM PT RTRN 9FT ADLT (ELECTROSURGICAL) ×1 IMPLANT
EVACUATOR SILICONE 100CC (DRAIN) IMPLANT
GLOVE BIOGEL M 8.0 STRL (GLOVE) ×2 IMPLANT
GOWN STRL REIN XL XLG (GOWN DISPOSABLE) ×6 IMPLANT
KIT BASIN OR (CUSTOM PROCEDURE TRAY) ×2 IMPLANT
MARKER SKIN DUAL TIP RULER LAB (MISCELLANEOUS) ×2 IMPLANT
NEEDLE SPNL 22GX3.5 QUINCKE BK (NEEDLE) IMPLANT
PENCIL BUTTON HOLSTER BLD 10FT (ELECTRODE) ×2 IMPLANT
RELOAD ENDO STITCH 2.0 (ENDOMECHANICALS) ×1
SCALPEL HARMONIC ACE (MISCELLANEOUS) IMPLANT
SET IRRIG TUBING LAPAROSCOPIC (IRRIGATION / IRRIGATOR) IMPLANT
SLEEVE XCEL OPT CAN 5 100 (ENDOMECHANICALS) ×4 IMPLANT
SOLUTION ANTI FOG 6CC (MISCELLANEOUS) ×2 IMPLANT
STAPLER VISISTAT 35W (STAPLE) IMPLANT
STRIP CLOSURE SKIN 1/2X4 (GAUZE/BANDAGES/DRESSINGS) IMPLANT
SUT NOVA 0 T19/GS 22DT (SUTURE) IMPLANT
SUT NOVA NAB DX-16 0-1 5-0 T12 (SUTURE) IMPLANT
SUT PROLENE 0 CT 2 (SUTURE) ×2 IMPLANT
SUT RELOAD ENDO STITCH 2.0 (ENDOMECHANICALS) ×1
SUT VIC AB 4-0 SH 18 (SUTURE) ×2 IMPLANT
SUTURE RELOAD ENDO STITCH 2.0 (ENDOMECHANICALS) ×1 IMPLANT
TACKER 5MM HERNIA 3.5CML NAB (ENDOMECHANICALS) IMPLANT
TOWEL OR NON WOVEN STRL DISP B (DISPOSABLE) ×2 IMPLANT
TRAY FOLEY CATH 14FRSI W/METER (CATHETERS) IMPLANT
TRAY LAP CHOLE (CUSTOM PROCEDURE TRAY) ×2 IMPLANT
TROCAR BLADELESS OPT 5 100 (ENDOMECHANICALS) ×2 IMPLANT
TROCAR XCEL NON-BLD 11X100MML (ENDOMECHANICALS) ×2 IMPLANT
TUBING INSUFFLATION 10FT LAP (TUBING) ×2 IMPLANT

## 2012-12-23 NOTE — Transfer of Care (Signed)
Immediate Anesthesia Transfer of Care Note  Patient: Emma Gonzales  Procedure(s) Performed: Procedure(s): LAPAROSCOPY  REPAIR  UMBILICAL HERNIA, CLOSURE OF INTERNAL HERNIA DEFECT (N/A)  Patient Location: PACU  Anesthesia Type:General  Level of Consciousness: awake and alert   Airway & Oxygen Therapy: Patient Spontanous Breathing and Patient connected to face mask oxygen  Post-op Assessment: Report given to PACU RN and Post -op Vital signs reviewed and stable  Post vital signs: Reviewed and stable  Complications: No apparent anesthesia complications

## 2012-12-23 NOTE — Op Note (Signed)
Surgeon: Wenda Low, MD, FACS  Asst:  Lodema Pilot, D.O  Anes:  general  Procedure: Laparoscopy, repair of internal hernia, repair of chronically incarcerated umbilical hernia  Diagnosis: Internal hernia defect without evidence of bowel involvement, small fat containing incarcerated umbilical hernia  Complications: none  EBL:   12 cc  Description of Procedure:  The patient was taken to oh or 11 in given general anesthesia. The abdomen was prepped with PCMX and draped sterilely. A timeout was performed. A access to the abdomen was achieved through the left upper quadrant with a 0 5 mm Optiview technique. A 5 mm was placed on the left side inferiorly on the right side in the right upper quadrant as well. The right lower quadrant was so spleen large 211 mm for the Endo Stitch. A first ran the bowel from the terminal ileum proximally and there wasthe bowel actually herniated any defect. However in inspecting the jejunojejunostomy a found a small defect in the mesentery that I closed with a figure-of-eight suture of 2-0 silk. This obliterated this hernia defect. At the umbilicus I could feel the incarcerated fat was in this little hernia and we reduced some of that from the inside. A weighted cut down into the umbilicus reflecting the skin upward and removed the hernia sac which contained fat incarcerated chronically. Once this was amputated I repaired the defect with 2 simple sutures of #1 Prolene. This obliterated the defect. The wounds were all injected with Exparel. There were closed 4-0 Vicryl and Dermabond.  [name], MD, [facility], [place] [phone]

## 2012-12-23 NOTE — Anesthesia Preprocedure Evaluation (Addendum)
Anesthesia Evaluation Anesthesia Physical Anesthesia Plan  ASA: II  Anesthesia Plan:    Post-op Pain Management:    Induction:   Airway Management Planned:   Additional Equipment:   Intra-op Plan:   Post-operative Plan:   Informed Consent:   Plan Discussed with:   Anesthesia Plan Comments:         Anesthesia Quick Evaluation  

## 2012-12-23 NOTE — Interval H&P Note (Signed)
History and Physical Interval Note:  12/23/2012 8:42 AM  Emma Gonzales  has presented today for surgery, with the diagnosis of abdominal pain  The various methods of treatment have been discussed with the patient and family. After consideration of risks, benefits and other options for treatment, the patient has consented to  Procedure(s): LAPAROSCOPIC AND REPAIR  UMBILICAL HERNIA (N/A) as a surgical intervention .  The patient's history has been reviewed, patient examined, no change in status, stable for surgery.  I have reviewed the patient's chart and labs.  Questions were answered to the patient's satisfaction.     Emma Gonzales B

## 2012-12-23 NOTE — H&P (Signed)
Chief Complaint: Intermittent cramping abdominal pain and umbilical hernia  History of Present Illness: Emma Gonzales is an 47 y.o. female who has lost 110 pounds since her Roux-en-Y gastric bypass.over the last year she's had intermittent crampy abdominal pain on the right side. At one point this lasted a couple weeks and she had a CT scan which noted her umbilical hernia and saw no other abnormalities.  This has persisted off and on and yesterday she had some right-sided abdominal cramping pain that abated. I think that she is put up with this pain and would like to see if there is anything that can be done. I think it would be worth scheduling for laparoscopic assisted umbilical hernia repair and to do a laparoscopic enteral lysis necessary and also to look for evidence of an internal hernia.  Past Medical History   Diagnosis  Date   .  Obesity    .  Varicose vein of leg    .  Leg pain    .  Back pain    .  Gallstones    .  PONV (postoperative nausea and vomiting)  01-24-11     pt. has PONV with anesthesia occ.   .  Sleep apnea  01-24-11     no cpap used   .  GERD (gastroesophageal reflux disease)  01-24-11     reflux with hiatal hernia-no regular meds   .  Hiatal hernia  01-24-11     hx. of this recent dx./no nerve issues   .  Anxiety attack  01-24-11     previoulsy and only once    Past Surgical History   Procedure  Laterality  Date   .  Tubal ligation     .  Endometrial ablation w/ novasure     .  Cholecystectomy   01-24-11     '96-Laparoscopic   .  Foot surgery   01-24-11     bilateral   .  Gastric roux-en-y   01/28/2011     Procedure: LAPAROSCOPIC ROUX-EN-Y GASTRIC; Surgeon: Valarie Merino, MD; Location: [facility]; Service: General; Laterality: N/A; With upper endoscopy    Current Outpatient Prescriptions   Medication  Sig  Dispense  Refill   .  ALPRAZolam (XANAX) 0.25 MG tablet  Take 0.25 mg by mouth as needed.     .  calcium citrate-vitamin D 200-200 MG-UNIT TABS  Take 5  tablets by mouth daily.     .  Multiple Vitamins-Minerals (MULTIVITAMIN WITH MINERALS) tablet  Take 1 tablet by mouth 2 (two) times daily.      No current facility-administered medications for this visit.   Amoxicillin  Family History   Problem  Relation  Age of Onset   .  Diabetes  Mother    .  Heart disease  Mother      Not sure what type of heart disease   .  Hyperlipidemia  Father    .  Diabetes  Brother    .  Heart attack  Father    Social History: reports that she has never smoked. She has never used smokeless tobacco. She reports that she drinks about 3.5 ounces of alcohol per week. She reports that she does not use illicit drugs.  REVIEW OF SYSTEMS - PERTINENT POSITIVES ONLY:  No DVT. Other systems are negative  Physical Exam:  Blood pressure 118/72, pulse 60, resp. rate 14, height 5' 4.5" (1.638 m), weight 216 lb 3.2 oz (98.068 kg).  Body mass index  is 36.55 kg/(m^2).  Gen: WDWN African American female NAD  Neurological: Alert and oriented to person, place, and time. Motor and sensory function is grossly intact  Head: Normocephalic and atraumatic.  Eyes: Conjunctivae are normal. Pupils are equal, round, and reactive to light. No scleral icterus.  Neck: Normal range of motion. Neck supple. No tracheal deviation or thyromegaly present.  Cardiovascular: SR without murmurs or gallops. No carotid bruits  Respiratory: Effort normal. No respiratory distress. No chest wall tenderness. Breath sounds normal. No wheezes, rales or rhonchi.  Abdomen: Redundant skin and no incarceration of the umbilical hernia. No rebound or guarding or tenderness at this time.  GU:  Musculoskeletal: Normal range of motion. Extremities are nontender. No cyanosis, edema or clubbing noted Lymphadenopathy: No cervical, preauricular, postauricular or axillary adenopathy is present Skin: Skin is warm and dry. No rash noted. No diaphoresis. No erythema. No pallor. Pscyh: Normal mood and affect. Behavior is normal.  Judgment and thought content normal.  LABORATORY RESULTS:  No results found for this or any previous visit (from the past 48 hour(s)).  RADIOLOGY RESULTS:  No results found.  Problem List:  Patient Active Problem List    Diagnosis  Date Noted   .  PAC (premature atrial contraction)  02/05/2012   .  Lap gastric bypass Nov 2012  06/19/2011   .  Palpitations  06/14/2011   .  History of sleep apnea  06/14/2011   .  Obesity  10/17/2010   Assessment & Plan:  Concern for possible internal hernia versus intermittent obstruction in umbilical hernia. Plan laparoscopic exploration with repair of umbilical hernia and search for internal hernia.  [name], MD, [facility]  [phone] beeper  [phone]

## 2012-12-23 NOTE — Anesthesia Postprocedure Evaluation (Signed)
  Anesthesia Post-op Note  Patient: Emma Gonzales  Procedure(s) Performed: Procedure(s) (LRB): LAPAROSCOPY  REPAIR  UMBILICAL HERNIA, CLOSURE OF INTERNAL HERNIA DEFECT (N/A)  Patient Location: PACU  Anesthesia Type: General  Level of Consciousness: awake and alert   Airway and Oxygen Therapy: Patient Spontanous Breathing  Post-op Pain: mild  Post-op Assessment: Post-op Vital signs reviewed, Patient's Cardiovascular Status Stable, Respiratory Function Stable, Patent Airway and No signs of Nausea or vomiting  Last Vitals:  Filed Vitals:   12/23/12 1129  BP: 123/70  Pulse: 57  Temp: 36 C  Resp: 16    Post-op Vital Signs: stable   Complications: No apparent anesthesia complications

## 2012-12-24 ENCOUNTER — Encounter (HOSPITAL_COMMUNITY): Payer: Self-pay | Admitting: Surgery

## 2013-01-08 ENCOUNTER — Ambulatory Visit (INDEPENDENT_AMBULATORY_CARE_PROVIDER_SITE_OTHER): Payer: 59 | Admitting: Surgery

## 2013-01-08 ENCOUNTER — Encounter (INDEPENDENT_AMBULATORY_CARE_PROVIDER_SITE_OTHER): Payer: Self-pay

## 2013-01-08 VITALS — BP 136/74 | HR 60 | Temp 98.0°F | Resp 18 | Ht 64.5 in | Wt 219.0 lb

## 2013-01-08 DIAGNOSIS — Z09 Encounter for follow-up examination after completed treatment for conditions other than malignant neoplasm: Secondary | ICD-10-CM

## 2013-01-08 NOTE — Progress Notes (Signed)
Emma Gonzales 47 y.o.  Body mass index is 37.02 kg/(m^2).  Patient Active Problem List   Diagnosis Date Noted  . PAC (premature atrial contraction) 02/05/2012  . Lap gastric bypass Nov 2012 06/19/2011  . Palpitations 06/14/2011  . History of sleep apnea 06/14/2011  . Obesity 10/17/2010    Allergies  Allergen Reactions  . Amoxicillin     Yeast infection    Past Surgical History  Procedure Laterality Date  . Tubal ligation    . Endometrial ablation w/ novasure    . Cholecystectomy  01-24-11    '96-Laparoscopic  . Foot surgery  01-24-11    bilateral  . Gastric roux-en-y  01/28/2011    Procedure: LAPAROSCOPIC ROUX-EN-Y GASTRIC;  Surgeon: Valarie Merino, MD;  Location: [facility];  Service: General;  Laterality: N/A;  With upper endoscopy  . Umbilical hernia repair N/A 12/23/2012    Procedure: LAPAROSCOPY  REPAIR  UMBILICAL HERNIA, CLOSURE OF INTERNAL HERNIA DEFECT;  Surgeon: Valarie Merino, MD;  Location: [facility];  Service: General;  Laterality: N/A;   [name], MD No diagnosis found.  Doing well.  Some hygiene issues related to depth of her umbilical incision.  Repairs intact.  Currently she is 2 years out from a Roux-en-Y gastric bypass. I will schedule to see her in November of 2015 which will be her third year. [name], MD, [facility] [phone] beeper [phone]  01/08/2013 3:35 PM

## 2013-01-08 NOTE — Patient Instructions (Signed)
Thanks for your patience.  If you need further assistance after leaving the office, please call our office and speak with a [facility] nurse.  [phone].  If you want to leave a message for Dr. Shaniqua Guillot, please call his office phone at [phone]. 

## 2013-04-01 ENCOUNTER — Ambulatory Visit: Payer: 59 | Admitting: Family Medicine

## 2013-04-01 VITALS — BP 108/68 | HR 60 | Temp 98.4°F | Resp 16 | Ht 65.0 in | Wt 219.0 lb

## 2013-04-01 DIAGNOSIS — Z202 Contact with and (suspected) exposure to infections with a predominantly sexual mode of transmission: Secondary | ICD-10-CM

## 2013-04-01 DIAGNOSIS — N760 Acute vaginitis: Secondary | ICD-10-CM

## 2013-04-01 LAB — POCT WET PREP WITH KOH
KOH Prep POC: NEGATIVE
TRICHOMONAS UA: NEGATIVE
Yeast Wet Prep HPF POC: NEGATIVE

## 2013-04-01 MED ORDER — METRONIDAZOLE 500 MG PO TABS: 500.0000 mg | ORAL_TABLET | Freq: Two times a day (BID) | ORAL | Status: DC

## 2013-04-01 NOTE — Patient Instructions (Signed)
Take the metronidazole one twice daily  No alcohol on the metronidazole  Return if worse  Can consider trying some Gyne-Lotrimin if this does not work.

## 2013-04-01 NOTE — Progress Notes (Signed)
Subjective: Patient has a vaginal discharge for the past week with a bad odor to it. Years ago she had trichomonas, but cannot recall if it smells like that. She has also had BV in the past. She has a regular boyfriend for the last 4 months, and doesn't know whether there is danger of him having messed around or not. She is pretty discouraged today. They have not been using a conception. No history of diabetes  Objective: Pleasant overweight lady in no acute distress. No CVA tenderness. Abdomen soft without masses or tenderness. Pelvic exam shows the external genitalia to appear normal. Vaginal mucosa unremarkable except for a creamy whitish discharge. No clots were noted. Wet prep and Gen-Probe were taken. Patient tolerated things well. Bimanual exam reveals no adnexal or uterine masses.  Assessment: Vaginal discharge Unprotected sex with STD risk  Plan: Wet prep Gen-Probe  Results for orders placed in visit on 04/01/13  POCT WET PREP WITH KOH      Result Value Range   Trichomonas, UA Negative     Clue Cells Wet Prep HPF POC 2-3     Epithelial Wet Prep HPF POC 1-3     Yeast Wet Prep HPF POC neg     Bacteria Wet Prep HPF POC 1+     RBC Wet Prep HPF POC 0-2     WBC Wet Prep HPF POC 15-20     KOH Prep POC Negative     Although no obvious etiology of the vaginitis, I'm going to treat with metronidazole since her symptoms and odor are still consistent with a BV type infection. Try some Gyne-Lotrimin is not doing better

## 2013-04-02 LAB — RPR

## 2013-04-02 LAB — HIV ANTIBODY (ROUTINE TESTING W REFLEX): HIV: NONREACTIVE

## 2013-04-03 LAB — GC/CHLAMYDIA PROBE AMP
CT PROBE, AMP APTIMA: NEGATIVE
GC PROBE AMP APTIMA: NEGATIVE

## 2013-04-23 ENCOUNTER — Ambulatory Visit (INDEPENDENT_AMBULATORY_CARE_PROVIDER_SITE_OTHER): Payer: 59 | Admitting: Internal Medicine

## 2013-04-23 ENCOUNTER — Encounter: Payer: Self-pay | Admitting: Internal Medicine

## 2013-04-23 VITALS — BP 116/72 | HR 80 | Temp 98.5°F | Wt 213.0 lb

## 2013-04-23 DIAGNOSIS — J019 Acute sinusitis, unspecified: Secondary | ICD-10-CM

## 2013-04-23 DIAGNOSIS — J09X2 Influenza due to identified novel influenza A virus with other respiratory manifestations: Secondary | ICD-10-CM

## 2013-04-23 MED ORDER — AZITHROMYCIN 250 MG PO TABS: ORAL_TABLET | ORAL | Status: DC

## 2013-04-23 NOTE — Progress Notes (Signed)
   Subjective:    Patient ID: Emma Gonzales, female    DOB: [birth year], 48 y.o.   MRN: [id number]  HPI  Onset February 9 of sore throat, cough, congestion, fever and myalgias. Also had shaking chills. Now has hoarseness and discolored nasal drainage. Has malaise and fatigue.    Review of Systems     Objective:   Physical Exam Sounds hoarse when she speaks. Pharynx is clear. TMs are clear. Neck is supple. Chest clear. Has congested cough.       Assessment & Plan:  Acute sinusitis  Influenza  Plan: Zithromax Z-Pak take 2 tablets day one followed by 1 tablet days 2 through 5.

## 2013-04-23 NOTE — Patient Instructions (Signed)
Take Zithromax Z-PAK as corrected. Call if not better in 7-10 days.

## 2013-04-26 ENCOUNTER — Telehealth: Payer: Self-pay | Admitting: Internal Medicine

## 2013-04-26 NOTE — Telephone Encounter (Signed)
Call in Diflucan 150 mg.

## 2013-04-29 ENCOUNTER — Telehealth: Payer: Self-pay

## 2013-04-29 ENCOUNTER — Other Ambulatory Visit: Payer: Self-pay

## 2013-04-29 MED ORDER — FLUCONAZOLE 150 MG PO TABS: 150.0000 mg | ORAL_TABLET | Freq: Once | ORAL | Status: DC

## 2013-04-29 NOTE — Telephone Encounter (Signed)
Patient states she thinks she may have a yeast infection; has vaginal itching. Finished antibiotics. OK per Dr. Lenord FellersBaxley for Diflucan 150mg  tablet with one refill, sent to [facility] [address]

## 2013-05-28 DIAGNOSIS — E881 Lipodystrophy, not elsewhere classified: Secondary | ICD-10-CM | POA: Insufficient documentation

## 2013-05-28 DIAGNOSIS — N6481 Ptosis of breast: Secondary | ICD-10-CM | POA: Insufficient documentation

## 2013-06-16 ENCOUNTER — Other Ambulatory Visit (HOSPITAL_COMMUNITY): Payer: Self-pay | Admitting: Obstetrics and Gynecology

## 2013-06-16 DIAGNOSIS — Z1231 Encounter for screening mammogram for malignant neoplasm of breast: Secondary | ICD-10-CM

## 2013-07-05 ENCOUNTER — Ambulatory Visit (HOSPITAL_COMMUNITY)
Admission: RE | Admit: 2013-07-05 | Discharge: 2013-07-05 | Disposition: A | Discharge status: Home / Self Care | Payer: 59 | Source: Ambulatory Visit | Attending: Obstetrics and Gynecology | Admitting: Obstetrics and Gynecology

## 2013-07-05 DIAGNOSIS — Z1231 Encounter for screening mammogram for malignant neoplasm of breast: Secondary | ICD-10-CM | POA: Insufficient documentation

## 2013-07-07 ENCOUNTER — Ambulatory Visit (INDEPENDENT_AMBULATORY_CARE_PROVIDER_SITE_OTHER): Payer: 59 | Admitting: Podiatry

## 2013-07-07 ENCOUNTER — Encounter: Payer: Self-pay | Admitting: Podiatry

## 2013-07-07 VITALS — BP 108/68 | HR 55 | Ht 64.5 in | Wt 219.0 lb

## 2013-07-07 DIAGNOSIS — Q828 Other specified congenital malformations of skin: Secondary | ICD-10-CM | POA: Insufficient documentation

## 2013-07-07 DIAGNOSIS — R234 Changes in skin texture: Secondary | ICD-10-CM

## 2013-07-07 DIAGNOSIS — M79606 Pain in leg, unspecified: Secondary | ICD-10-CM | POA: Insufficient documentation

## 2013-07-07 DIAGNOSIS — M79609 Pain in unspecified limb: Secondary | ICD-10-CM

## 2013-07-07 NOTE — Progress Notes (Signed)
Subjective: 48 y.o. year old female patient presents complaining of painful calluses on both feet. She was trying to shave them off but was not successful.   Objective: Dermatologic: Thick porokeratotic lesion left heel. Eschar with porokeratosis under 3rd MPJ right foot. Multiple circular porokeratosis plantar bilateral.  Vascular: Pedal pulses are all palpable. Orthopedic: Contracted lesser digits with digital corn at distal end.  Neurologic: All epicritic and tactile sensations grossly intact.  Assessment: Dystrophic plantar porokeratosis and eschar bilateral.  Treatment: All calluses debrided.  Return in 3 months or as needed.

## 2013-07-07 NOTE — Patient Instructions (Signed)
Seen for painful calluses. All debrided. Return in 3 months.

## 2013-12-20 ENCOUNTER — Encounter: Payer: Self-pay | Admitting: Internal Medicine

## 2014-01-03 ENCOUNTER — Ambulatory Visit (INDEPENDENT_AMBULATORY_CARE_PROVIDER_SITE_OTHER): Payer: 59 | Admitting: Podiatry

## 2014-01-03 ENCOUNTER — Encounter: Payer: Self-pay | Admitting: Podiatry

## 2014-01-03 VITALS — BP 121/78 | HR 55 | Ht 64.0 in | Wt 219.0 lb

## 2014-01-03 DIAGNOSIS — M79606 Pain in leg, unspecified: Secondary | ICD-10-CM

## 2014-01-03 DIAGNOSIS — Q828 Other specified congenital malformations of skin: Secondary | ICD-10-CM

## 2014-01-03 DIAGNOSIS — R234 Changes in skin texture: Secondary | ICD-10-CM

## 2014-01-03 NOTE — Patient Instructions (Signed)
Debrided all painful calluses. Return as needed. 

## 2014-01-03 NOTE — Progress Notes (Signed)
Subjective:  48 y.o. year old female patient presents complaining of painful calluses on both feet.   Objective: Dermatologic: Thick porokeratotic lesion left heel. Eschar with porokeratosis under 3rd MPJ right foot. Multiple circular porokeratosis plantar bilateral.  Vascular: Pedal pulses are all palpable.  Orthopedic: Contracted lesser digits with digital corn at distal end.  Hypermobile first ray with bunion deformity bilateral. Neurologic: All epicritic and tactile sensations grossly intact.   Assessment:  Dystrophic plantar porokeratosis and eschar bilateral.  HAV with bunion bilateral. Hypermobile first ray with weight transfer to lateral column.   Plan: All calluses debrided.  All options reviewed. Return as needed.  Return in 3 months or as needed.

## 2014-06-17 ENCOUNTER — Other Ambulatory Visit (HOSPITAL_COMMUNITY): Payer: Self-pay | Admitting: Obstetrics and Gynecology

## 2014-06-17 DIAGNOSIS — Z1231 Encounter for screening mammogram for malignant neoplasm of breast: Secondary | ICD-10-CM

## 2014-07-08 ENCOUNTER — Ambulatory Visit (HOSPITAL_COMMUNITY)
Admission: RE | Admit: 2014-07-08 | Discharge: 2014-07-08 | Disposition: A | Discharge status: Home / Self Care | Payer: 59 | Source: Ambulatory Visit | Attending: Obstetrics and Gynecology | Admitting: Obstetrics and Gynecology

## 2014-07-08 DIAGNOSIS — Z1231 Encounter for screening mammogram for malignant neoplasm of breast: Secondary | ICD-10-CM | POA: Diagnosis not present

## 2014-07-18 ENCOUNTER — Encounter: Payer: Self-pay | Admitting: Internal Medicine

## 2014-07-18 ENCOUNTER — Ambulatory Visit (INDEPENDENT_AMBULATORY_CARE_PROVIDER_SITE_OTHER): Payer: 59 | Admitting: Internal Medicine

## 2014-07-18 VITALS — BP 120/82 | HR 68 | Temp 97.9°F | Wt 235.0 lb

## 2014-07-18 DIAGNOSIS — H6503 Acute serous otitis media, bilateral: Secondary | ICD-10-CM | POA: Diagnosis not present

## 2014-07-18 DIAGNOSIS — F410 Panic disorder [episodic paroxysmal anxiety] without agoraphobia: Secondary | ICD-10-CM | POA: Diagnosis not present

## 2014-07-18 DIAGNOSIS — J069 Acute upper respiratory infection, unspecified: Secondary | ICD-10-CM | POA: Diagnosis not present

## 2014-07-18 MED ORDER — AZITHROMYCIN 250 MG PO TABS: ORAL_TABLET | ORAL | Status: DC

## 2014-07-18 MED ORDER — ALPRAZOLAM 0.5 MG PO TABS: ORAL_TABLET | ORAL | Status: DC

## 2014-07-18 NOTE — Patient Instructions (Signed)
Take Zithromax Z-PAK as directed. Take Xanax one hour before driving over bridge. Consider counseling for panic disorder.

## 2014-07-18 NOTE — Progress Notes (Signed)
   Subjective:    Patient ID: Emma Gonzales, female    DOB: 21-Oct-1965, 49 y.o.   MRN: [id number]  HPI Onset last week of sore throat symptoms which developed into more upper respiratory infection symptoms with nasal congestion and slight cough. No sputum production. No fever or shaking chills. Had malaise and fatigue.  Also brings up a new issue today, patient admits to having issues driving over bridges such as [place] near [address] [place], [place]. Wants to go to the beach later in the year and is afraid she'll have trouble driving upper bridge. Will have her children with her. Apparently had a panic attack driving over bridge at the beach a while back. Says her family laughs at her and this distresses her. Doesn't understand why she has this fear. Spent some 10 minutes speaking with her about this issue, cause and treatment.    Review of Systems     Objective:   Physical Exam Both TMs are full bilaterally but not red. Pharynx clear. Neck is supple. Chest clear to auscultation.       Assessment & Plan:  Acute serous bilateral otitis media-10 minutes spent assessing respiratory symptoms  Acute URI  Panic disorder  Plan: Xanax 0.5 mg to take one hour before driving over bridge. Zithromax Z-PAK take 2 tablets day one followed by 1 tablet days 2 through 5. May need counseling for panic disorder.

## 2014-08-01 ENCOUNTER — Encounter: Payer: Self-pay | Admitting: Dietician

## 2014-08-01 ENCOUNTER — Encounter: Payer: 59 | Attending: Internal Medicine | Admitting: Dietician

## 2014-08-01 VITALS — Wt 233.2 lb

## 2014-08-01 DIAGNOSIS — Z6841 Body Mass Index (BMI) 40.0 and over, adult: Secondary | ICD-10-CM | POA: Insufficient documentation

## 2014-08-01 DIAGNOSIS — Z713 Dietary counseling and surveillance: Secondary | ICD-10-CM | POA: Diagnosis not present

## 2014-08-01 DIAGNOSIS — E669 Obesity, unspecified: Secondary | ICD-10-CM | POA: Diagnosis not present

## 2014-08-01 DIAGNOSIS — Z9884 Bariatric surgery status: Secondary | ICD-10-CM | POA: Insufficient documentation

## 2014-08-01 NOTE — Progress Notes (Signed)
  Medical Nutrition Therapy:  Appt start time: 0300 end time:  350   Assessment:  Primary concerns today: Emma Gonzales is here today to discuss her weight regain. She had RYGB in 2012 and lost a total of 110 pounds. Goal weight: 170 lbs. Recently gained about 10 pounds. Drinks beer 2x a week and does not like drinking water. She also recently stopped walking regularly. Emma Gonzales reports that she takes her vitamin and calcium supplements regularly. 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 She has been experiencing financial stress and stress with her oldest daughter.  Preferred Learning Style:   No preference indicated   Learning Readiness:   Ready  MEDICATIONS: see list   DIETARY INTAKE:  Usual eating pattern includes 3 meals and 2 snacks per day.  Avoided foods include liver, black eyed peas.    24-hr recall:  B ( AM): yogurt   Snk ( AM): animal crackers or baked cheetos  L ( PM): Lean Cuisine (chicken, noodles, and broccoli) and applesauce Snk ( PM): ice cream occasionally or PBJ D ( PM): grilled meat, mixed vegetables Snk ( PM): none  Beverages: 2 beers a week, coffee with Splenda and cream, very little water  Usual physical activity: none recently  Estimated energy needs: 1200-1400 calories  Progress Towards Goal(s):  In progress.   Nutritional Diagnosis:  [place]-3.4 Unintentional weight gain As related to recent increase in energy intake, inappropriate food choices, and physical inactivity.  As evidenced by patient reports recent weight gain of 10 pounds.    Intervention:  Nutrition counseling provided. Reviewed post op dietary recommendations. Discussed strategies for getting back on track. Goals: -Look into seeing a counselor -Resume walking routine -Stash some healthy, high-protein foods at work and home  -Multimedia programmer[employer]annon [employer] and [employer] Greek yogurt, boiled eggs, leftovers, pre cooked [place]key bacon and sausage, small amount of  almonds -Focus on meat and vegetables  -Fill up on non starchy vegetables -Increase fluid intake: try Crystal Light (or any other sugar free flavoring) or Powerade Zero  Teaching Method Utilized:  Visual Auditory Hands on  Handouts given during visit include:  Phase 3A lean proteins  Phase 3B lean protein + non starchy vegetables  Bariatric snack ideas  Therapists in the area  Bariatric support group schedule  Barriers to learning/adherence to lifestyle change: financial constraints  Demonstrated degree of understanding via:  Teach Back   Monitoring/Evaluation:  Dietary intake, exercise, and body weight in 6 week(s).

## 2014-08-01 NOTE — Patient Instructions (Addendum)
-  Look into seeing a counselor  -Resume walking routine  -Stash some healthy, high-protein foods at work and home  -Mult[employer] programmer[employer]annon Light and [employer] Greek yogurt, boiled eggs, leftovers, pre cooked [place] bacon and sausage, small amount of almonds  -Focus on meat and vegetables  -Fill up on non starchy vegetables  -Increase fluid intake: try Crystal Light (or any other sugar free flavoring) or Powerade Zero

## 2014-09-19 ENCOUNTER — Ambulatory Visit: Payer: 59 | Admitting: Dietician

## 2014-10-13 ENCOUNTER — Ambulatory Visit (INDEPENDENT_AMBULATORY_CARE_PROVIDER_SITE_OTHER): Payer: 59 | Admitting: Internal Medicine

## 2014-10-13 ENCOUNTER — Encounter: Payer: Self-pay | Admitting: Internal Medicine

## 2014-10-13 VITALS — BP 112/70 | HR 59 | Temp 98.1°F | Wt 242.0 lb

## 2014-10-13 DIAGNOSIS — S60461A Insect bite (nonvenomous) of left index finger, initial encounter: Secondary | ICD-10-CM | POA: Diagnosis not present

## 2014-10-13 DIAGNOSIS — Z23 Encounter for immunization: Secondary | ICD-10-CM | POA: Diagnosis not present

## 2014-10-13 MED ORDER — LEVOFLOXACIN 500 MG PO TABS
500.0000 mg | ORAL_TABLET | Freq: Every day | ORAL | Status: DC
Start: 2014-10-13 — End: 2014-11-03

## 2014-10-19 ENCOUNTER — Ambulatory Visit (INDEPENDENT_AMBULATORY_CARE_PROVIDER_SITE_OTHER): Payer: 59 | Admitting: Podiatry

## 2014-10-19 ENCOUNTER — Encounter: Payer: Self-pay | Admitting: Podiatry

## 2014-10-19 VITALS — BP 126/70 | HR 60 | Ht 64.0 in | Wt 242.0 lb

## 2014-10-19 DIAGNOSIS — R234 Changes in skin texture: Secondary | ICD-10-CM

## 2014-10-19 DIAGNOSIS — M79606 Pain in leg, unspecified: Secondary | ICD-10-CM

## 2014-10-19 DIAGNOSIS — M79673 Pain in unspecified foot: Secondary | ICD-10-CM

## 2014-10-19 DIAGNOSIS — Q828 Other specified congenital malformations of skin: Secondary | ICD-10-CM

## 2014-10-19 NOTE — Patient Instructions (Signed)
Seen for hypertrophic calluses. All calluses debrided. Return in 3 months or as needed.  

## 2014-10-19 NOTE — Progress Notes (Signed)
Subjective:  49 y.o. year old female patient presents complaining of painful calluses on both feet.   Objective: Dermatologic: Thick porokeratotic lesion left heel. Eschar with porokeratosis under 3rd MPJ right foot. Multiple circular porokeratosis plantar bilateral.  Vascular: Pedal pulses are all palpable.  Orthopedic: Contracted lesser digits with digital corn at distal end.  Hypermobile first ray with bunion deformity bilateral. Neurologic: All epicritic and tactile sensations grossly intact.   Assessment:  Dystrophic plantar porokeratosis and eschar bilateral.  HAV with bunion bilateral. Hypermobile first ray with weight transfer to lateral column.   Plan: All calluses debrided.  All options reviewed. Return as needed.  Return in 3 months or as needed.  

## 2014-11-03 ENCOUNTER — Ambulatory Visit (INDEPENDENT_AMBULATORY_CARE_PROVIDER_SITE_OTHER): Payer: 59 | Admitting: Internal Medicine

## 2014-11-03 ENCOUNTER — Encounter: Payer: Self-pay | Admitting: Internal Medicine

## 2014-11-03 VITALS — BP 122/72 | HR 64 | Temp 98.0°F | Wt 238.0 lb

## 2014-11-03 DIAGNOSIS — M25551 Pain in right hip: Secondary | ICD-10-CM | POA: Diagnosis not present

## 2014-11-03 MED ORDER — MELOXICAM 15 MG PO TABS: 15.0000 mg | ORAL_TABLET | Freq: Every day | ORAL | Status: DC

## 2014-11-03 MED ORDER — HYDROCODONE-ACETAMINOPHEN 5-325 MG PO TABS: 1.0000 | ORAL_TABLET | Freq: Four times a day (QID) | ORAL | Status: DC | PRN

## 2014-11-04 ENCOUNTER — Ambulatory Visit
Admission: RE | Admit: 2014-11-04 | Discharge: 2014-11-04 | Disposition: A | Discharge status: Home / Self Care | Payer: 59 | Source: Ambulatory Visit | Attending: Internal Medicine | Admitting: Internal Medicine

## 2014-11-04 DIAGNOSIS — M25551 Pain in right hip: Secondary | ICD-10-CM

## 2014-11-07 NOTE — Progress Notes (Signed)
   Subjective:    Patient ID: Emma Gonzales, female    DOB: 1965-08-16, 49 y.o.   MRN: [id number]  HPI  Phas had pain in left index finger for 4 days. Doesn't recall any type of injury to left index finger. Pain is rather severe. Patient notes there appears to be a pinpoint discolored area that could be the site of an insect bite or sting. Doesn't recall having any bite or staying. However says there are insects at work.    Review of Systems     Objective:   Physical Exam  Left index finger is tender with decreased range of motion due to pain. No pus expressed. Adjacent to nail bed there appears to be an insect bite      Assessment & Plan:  Probable insect bite  Plan: Levaquin 500 milligrams daily for 7 days. Soak left index finger in warm soapy water for 20 minutes once or twice daily. Call if not better in 24-48 hours or sooner if worse.

## 2014-11-08 NOTE — Patient Instructions (Addendum)
Take Mobitc 15 mg daily. Take hydrocodone/APAP sparingly for pain. Have x-ray of right hip.

## 2014-11-08 NOTE — Progress Notes (Signed)
   Subjective:    Patient ID: Emma Gonzales, female    DOB: 1965-11-23, 49 y.o.   MRN: [id number]  HPI Was here recently for probable insect bite left index finger. This has improved. It is no longer throbbing. Has left a dark macular area distal tip of left index finger volar aspect. Apparently of for 2 or 3 weeks she's had some throbbing in her right anterior thigh area. Also some groin pain. Doesn't recall any injury. Does have a fairly active job at work and does pick up some fairly heavy boxes.    Review of Systems     Objective:   Physical Exam Internal and external rotation of hips demonstrate good range of motion and some right hip pain with internal rotation. Straight leg raising is negative at 90 bilaterally. Deep tendon reflexes 2+ and symmetrical in the knees. Muscle strength is 5 over 5 in the lower extremities.        Assessment & Plan:  Right hip pain  Right anterior thigh pain  Plan: Have x-ray of right hip to see if patient has significant osteoarthritis. Try Modic 15 mg daily. Hydrocodone/APAP as needed for pain control sparingly. If symptoms persist, see orthopedist  Addendum: Right hip x-ray is negative

## 2014-11-22 ENCOUNTER — Encounter: Payer: Self-pay | Admitting: Internal Medicine

## 2014-11-22 ENCOUNTER — Ambulatory Visit (INDEPENDENT_AMBULATORY_CARE_PROVIDER_SITE_OTHER): Payer: 59 | Admitting: Internal Medicine

## 2014-11-22 VITALS — BP 124/70 | HR 61 | Temp 98.9°F | Wt 234.0 lb

## 2014-11-22 DIAGNOSIS — M79651 Pain in right thigh: Secondary | ICD-10-CM

## 2014-11-22 MED ORDER — HYDROCODONE-ACETAMINOPHEN 5-325 MG PO TABS: 1.0000 | ORAL_TABLET | Freq: Four times a day (QID) | ORAL | Status: DC | PRN

## 2014-11-22 MED ORDER — PREDNISONE 10 MG PO TABS: ORAL_TABLET | ORAL | Status: DC

## 2014-11-23 ENCOUNTER — Ambulatory Visit (HOSPITAL_COMMUNITY)
Admission: RE | Admit: 2014-11-23 | Discharge: 2014-11-23 | Disposition: A | Discharge status: Home / Self Care | Payer: 59 | Source: Ambulatory Visit | Attending: Internal Medicine | Admitting: Internal Medicine

## 2014-11-23 ENCOUNTER — Telehealth: Payer: Self-pay | Admitting: *Deleted

## 2014-11-23 DIAGNOSIS — M79651 Pain in right thigh: Secondary | ICD-10-CM | POA: Diagnosis not present

## 2014-11-23 LAB — COMPREHENSIVE METABOLIC PANEL
ALBUMIN: 3.7 g/dL (ref 3.6–5.1)
ALT: 17 U/L (ref 6–29)
AST: 20 U/L (ref 10–35)
Alkaline Phosphatase: 65 U/L (ref 33–115)
BUN: 12 mg/dL (ref 7–25)
CALCIUM: 8.7 mg/dL (ref 8.6–10.2)
CHLORIDE: 109 mmol/L (ref 98–110)
CO2: 25 mmol/L (ref 20–31)
Creat: 0.94 mg/dL (ref 0.50–1.10)
GLUCOSE: 71 mg/dL (ref 65–99)
POTASSIUM: 4.4 mmol/L (ref 3.5–5.3)
Sodium: 142 mmol/L (ref 135–146)
Total Bilirubin: 0.3 mg/dL (ref 0.2–1.2)
Total Protein: 6.2 g/dL (ref 6.1–8.1)

## 2014-11-23 LAB — CBC WITH DIFFERENTIAL/PLATELET
BASOS ABS: 0 10*3/uL (ref 0.0–0.1)
Basophils Relative: 0 % (ref 0–1)
EOS PCT: 1 % (ref 0–5)
Eosinophils Absolute: 0.1 10*3/uL (ref 0.0–0.7)
HEMATOCRIT: 39.2 % (ref 36.0–46.0)
Hemoglobin: 12.9 g/dL (ref 12.0–15.0)
LYMPHS ABS: 2.7 10*3/uL (ref 0.7–4.0)
Lymphocytes Relative: 46 % (ref 12–46)
MCH: 29.5 pg (ref 26.0–34.0)
MCHC: 32.9 g/dL (ref 30.0–36.0)
MCV: 89.7 fL (ref 78.0–100.0)
MPV: 9.2 fL (ref 8.6–12.4)
Monocytes Absolute: 0.5 10*3/uL (ref 0.1–1.0)
Monocytes Relative: 9 % (ref 3–12)
Neutro Abs: 2.6 10*3/uL (ref 1.7–7.7)
Neutrophils Relative %: 44 % (ref 43–77)
Platelets: 226 10*3/uL (ref 150–400)
RBC: 4.37 MIL/uL (ref 3.87–5.11)
RDW: 14.7 % (ref 11.5–15.5)
WBC: 5.9 10*3/uL (ref 4.0–10.5)

## 2014-11-23 LAB — SEDIMENTATION RATE: Sed Rate: 1 mm/hr (ref 0–20)

## 2014-11-23 NOTE — Telephone Encounter (Signed)
Reviewed lab results with patient.

## 2014-11-23 NOTE — Telephone Encounter (Signed)
Spoke with patient review venous doppler results with patient

## 2014-11-23 NOTE — Progress Notes (Signed)
Preliminary results by tech - Right Lower Ext. Venous Completed. Negative for deep and superficial vein thrombosis in the right lower extremity. Anjana Cheek, BS, RDMS, RVT  

## 2014-11-23 NOTE — Telephone Encounter (Signed)
Left message for patient to call back to review lab results.

## 2014-12-09 NOTE — Patient Instructions (Signed)
Take prednisone in tapering course as prescribed. Take hydrocodone/APAP as prescribed. Warm hot compresses to right thigh. Call as symptoms do not improve.

## 2014-12-09 NOTE — Progress Notes (Signed)
   Subjective:    Patient ID: Emma Gonzales, female    DOB: [birth year], 49 y.o.   MRN: [id number]  HPI In today complaining of severe pain and right thigh. Doesn't have pain in back or hip area. Not sure what is causing this. Doesn't recall any strain or injury to her thigh area. No bite. No bruising.    Review of Systems     Objective:   Physical Exam  Internal and external rotation of both hips is normal. Straight leg raising her right leg is normal at 90. Some mild palpable tenderness of right anterior thigh. X-ray of right hip is normal.      Assessment & Plan:  Right thigh pain-etiology unclear -suspect musculoskeletal pain/strain  Plan: Sterapred DS 10 mg six-day Dosepak. Norco 5 mg as needed for severe pain. Warm hot compresses to right thigh. Call if not better in 7-10 days or sooner if worse.

## 2014-12-21 ENCOUNTER — Ambulatory Visit (INDEPENDENT_AMBULATORY_CARE_PROVIDER_SITE_OTHER): Payer: 59 | Admitting: Podiatry

## 2014-12-21 ENCOUNTER — Encounter: Payer: Self-pay | Admitting: Podiatry

## 2014-12-21 VITALS — BP 128/64 | HR 60

## 2014-12-21 DIAGNOSIS — Q828 Other specified congenital malformations of skin: Secondary | ICD-10-CM

## 2014-12-21 DIAGNOSIS — M79605 Pain in left leg: Secondary | ICD-10-CM

## 2014-12-21 DIAGNOSIS — R234 Changes in skin texture: Secondary | ICD-10-CM

## 2014-12-21 NOTE — Progress Notes (Signed)
Subjective:  49 y.o. year old female patient presents complaining of painful calluses on both feet. Especialy left foot is too painful to walk. She works standing on her feet all day.   Objective: Dermatologic: Thick porokeratotic lesion left heel. Eschar with porokeratosis under 3rd MPJ right foot. Multiple circular porokeratosis plantar bilateral.  Vascular: Pedal pulses are all palpable.  Orthopedic: Contracted lesser digits with digital corn at distal end.  Hypermobile first ray with bunion deformity bilateral. Neurologic: All epicritic and tactile sensations grossly intact.   Assessment:  Dystrophic plantar porokeratosis and eschar bilateral.  HAV with bunion bilateral. Hypermobile first ray with weight transfer to lateral column.   Plan: All painful lesions debrided.  Discussed applying acid patch over the left foot lesion for a week and return for office procedure to excise it.

## 2014-12-21 NOTE — Patient Instructions (Signed)
Painful calluses debrided. May use acid patch prior to excisional procedure.

## 2014-12-28 ENCOUNTER — Ambulatory Visit: Payer: 59 | Admitting: Podiatry

## 2015-01-29 NOTE — Patient Instructions (Addendum)
Soak finger in warm soapy water for 20 minutes once or twice daily. Take Levaquin 500 milligrams daily for 7 days. Call if not better in 20 to 48 hours or sooner if worse.

## 2015-02-14 ENCOUNTER — Telehealth: Payer: Self-pay | Admitting: Internal Medicine

## 2015-02-14 DIAGNOSIS — M25551 Pain in right hip: Secondary | ICD-10-CM

## 2015-02-14 NOTE — Telephone Encounter (Signed)
Calling with c/o R hip pain.  States that if she sits for long period of time, she has a hard time getting up.  And, if she's on her feet for long periods of time, she aches in her hips.  We have been seeing her for this pain since August.  Do you want to refer her out to ortho?

## 2015-02-14 NOTE — Telephone Encounter (Signed)
[facility]

## 2015-02-15 NOTE — Telephone Encounter (Signed)
Appt with Dr. Rayburn MaBlackmon scheduled for Wed. Dec 14th @ 9:30. Patient aware via voicemail

## 2015-05-24 ENCOUNTER — Encounter: Payer: Self-pay | Admitting: Podiatry

## 2015-05-24 ENCOUNTER — Other Ambulatory Visit: Payer: Self-pay

## 2015-05-24 ENCOUNTER — Ambulatory Visit (INDEPENDENT_AMBULATORY_CARE_PROVIDER_SITE_OTHER): Payer: 59 | Admitting: Podiatry

## 2015-05-24 VITALS — BP 124/69 | HR 69

## 2015-05-24 DIAGNOSIS — Q828 Other specified congenital malformations of skin: Secondary | ICD-10-CM | POA: Diagnosis not present

## 2015-05-24 DIAGNOSIS — M21969 Unspecified acquired deformity of unspecified lower leg: Secondary | ICD-10-CM

## 2015-05-24 DIAGNOSIS — M79606 Pain in leg, unspecified: Secondary | ICD-10-CM | POA: Diagnosis not present

## 2015-05-24 DIAGNOSIS — M659 Synovitis and tenosynovitis, unspecified: Secondary | ICD-10-CM

## 2015-05-24 NOTE — Patient Instructions (Signed)
Seen for pain in right foot and callus on left foot. Callus debrided. Surgical option discussed.

## 2015-05-24 NOTE — Progress Notes (Signed)
Subjective:  50 y.o. year old female patient presents complaining of painful calluses under ball of left (5th MPJ), and top of right foot. This is the same problem she had 3 years ago.  Objective: Dermatologic: Thick porokeratotic lesion left heel. Eschar with porokeratosis under 3rd MPJ right foot. Multiple circular porokeratosis plantar bilateral.  Vascular: Pedal pulses are all palpable.  Orthopedic: Contracted lesser digits with digital corn at distal end on right foot.  Hypermobile first ray with bunion deformity bilateral. Neurologic: All epicritic and tactile sensations grossly intact.   Radiographic examination reveal,  Ap view - Mild hallux valgus with bunion. Minimum lateral deviation of CCJ bilateral. Lateral view - Severe anterior break in CYMA line, elevated first metatarsal, positive of posterior Os Trigonum bilateral.  No acute changes noted. No osteophytic bone formation noted.   Assessment:  Tenosynovitis midtarsal bone right. HAV with bunion bilateral. Hypermobile first ray with weight transfer to lateral column.   Plan: Painful lesions debrided.  Reviewed available treatment options. Patient reviewed surgical option and consent form signed for Cotton osteotomy with bone graft right. Patient will call back with her schedule.

## 2015-05-31 ENCOUNTER — Other Ambulatory Visit: Payer: Self-pay | Admitting: Internal Medicine

## 2015-05-31 NOTE — Telephone Encounter (Signed)
Pt. Was given 30 tabs with one refill in May 2016. Does she still need?

## 2015-06-02 NOTE — Telephone Encounter (Signed)
Left message for patient to return call.

## 2015-06-07 NOTE — Telephone Encounter (Signed)
Left message for return call.

## 2015-06-15 ENCOUNTER — Other Ambulatory Visit: Payer: Self-pay | Admitting: Podiatry

## 2015-06-15 MED ORDER — HYDROCODONE-ACETAMINOPHEN 7.5-300 MG PO TABS: 1.0000 | ORAL_TABLET | Freq: Four times a day (QID) | ORAL | Status: DC | PRN

## 2015-06-15 MED ORDER — NABUMETONE 500 MG PO TABS: 500.0000 mg | ORAL_TABLET | Freq: Two times a day (BID) | ORAL | Status: DC

## 2015-06-16 DIAGNOSIS — M216X9 Other acquired deformities of unspecified foot: Secondary | ICD-10-CM | POA: Diagnosis not present

## 2015-06-16 HISTORY — PX: OTHER SURGICAL HISTORY: SHX169

## 2015-06-22 ENCOUNTER — Encounter: Payer: Self-pay | Admitting: Podiatry

## 2015-06-22 ENCOUNTER — Ambulatory Visit (INDEPENDENT_AMBULATORY_CARE_PROVIDER_SITE_OTHER): Payer: 59 | Admitting: Podiatry

## 2015-06-22 DIAGNOSIS — Z9889 Other specified postprocedural states: Secondary | ICD-10-CM

## 2015-06-22 NOTE — Patient Instructions (Signed)
Routine post op visit doing well. Continue with walker and may apply weight bearing as tolerated. Return in 2 weeks.

## 2015-06-22 NOTE — Progress Notes (Signed)
6 days post op wound right foot (06/16/15), Cotton osteotomy with bone graft. Came in with walker wearing fiber glass cast with walking heel on right lower limb. Denies any fever or other discomfort other than throbs when sitting in bathroom.  Taking pain medication q 6 hours.

## 2015-07-06 ENCOUNTER — Ambulatory Visit (INDEPENDENT_AMBULATORY_CARE_PROVIDER_SITE_OTHER): Payer: 59 | Admitting: Podiatry

## 2015-07-06 ENCOUNTER — Encounter: Payer: Self-pay | Admitting: Podiatry

## 2015-07-06 DIAGNOSIS — M79605 Pain in left leg: Secondary | ICD-10-CM

## 2015-07-06 DIAGNOSIS — M79673 Pain in unspecified foot: Secondary | ICD-10-CM

## 2015-07-06 DIAGNOSIS — M21969 Unspecified acquired deformity of unspecified lower leg: Secondary | ICD-10-CM | POA: Diagnosis not present

## 2015-07-06 NOTE — Patient Instructions (Signed)
Post op x-ray reveal dorsally displaced bone graft.  Reviewed the need for staple fixation.

## 2015-07-06 NOTE — Progress Notes (Signed)
3 weeks post op. Patient denies any discomfort.  Cast removed. Wound healing normal.  X-ray taken and noted of dorsally displaced bone graft.  Findings reviewed with the patient.  Reviewed the need for return to OR to secure the graft with staple. Patient signed consent form for the procedure next week.  Right lower limb placed in CAM walker.

## 2015-07-13 ENCOUNTER — Other Ambulatory Visit: Payer: Self-pay | Admitting: Podiatry

## 2015-07-13 MED ORDER — HYDROCODONE-ACETAMINOPHEN 7.5-300 MG PO TABS: 1.0000 | ORAL_TABLET | Freq: Four times a day (QID) | ORAL | Status: DC | PRN

## 2015-07-13 MED ORDER — NABUMETONE 500 MG PO TABS: 500.0000 mg | ORAL_TABLET | Freq: Two times a day (BID) | ORAL | Status: DC

## 2015-07-14 DIAGNOSIS — S92309A Fracture of unspecified metatarsal bone(s), unspecified foot, initial encounter for closed fracture: Secondary | ICD-10-CM | POA: Diagnosis not present

## 2015-07-14 HISTORY — PX: ORIF METATARSAL FRACTURE: SUR942

## 2015-07-20 ENCOUNTER — Encounter: Payer: Self-pay | Admitting: Podiatry

## 2015-07-20 ENCOUNTER — Ambulatory Visit (INDEPENDENT_AMBULATORY_CARE_PROVIDER_SITE_OTHER): Payer: 59 | Admitting: Podiatry

## 2015-07-20 VITALS — BP 131/86 | HR 61

## 2015-07-20 DIAGNOSIS — Z9889 Other specified postprocedural states: Secondary | ICD-10-CM

## 2015-07-20 NOTE — Patient Instructions (Signed)
6 days post op right foot doing well in cast. Continue with minimum ambulation and elevated lower limb while resting. Return in 3 weeks from the surgery.

## 2015-07-20 NOTE — Progress Notes (Signed)
6 days post op following ORIF with staple for displaced bone graft in cotton osteotomy with graft left foot.

## 2015-07-24 ENCOUNTER — Encounter: Payer: Self-pay | Admitting: *Deleted

## 2015-08-03 ENCOUNTER — Ambulatory Visit (INDEPENDENT_AMBULATORY_CARE_PROVIDER_SITE_OTHER): Payer: 59 | Admitting: Podiatry

## 2015-08-03 ENCOUNTER — Other Ambulatory Visit: Payer: Self-pay

## 2015-08-03 ENCOUNTER — Encounter: Payer: Self-pay | Admitting: Podiatry

## 2015-08-03 DIAGNOSIS — M21969 Unspecified acquired deformity of unspecified lower leg: Secondary | ICD-10-CM

## 2015-08-03 NOTE — Patient Instructions (Signed)
3 weeks post op wound healing well. X-ray show normal bone graft and staple position.  Cast re applied.  Return in 3 weeks.

## 2015-08-03 NOTE — Progress Notes (Signed)
3 weeks post op following ORIF (07/14/15) with staple for displaced bone graft in cotton osteotomy with graft right foot.  Patient denies any discomfort while in cast.  Cast removed and cleansed well.  Wound healing is normal without edema or erythema at surgery site. Sutures removed.   Right foot post op X-ray show good bone graft position with staple in place. Normal post op x-ray.  Left lower limb placed back in BK Fiberglass cast with.  Return in 3 weeks.

## 2015-08-04 ENCOUNTER — Ambulatory Visit (INDEPENDENT_AMBULATORY_CARE_PROVIDER_SITE_OTHER): Payer: 59 | Admitting: Podiatry

## 2015-08-04 ENCOUNTER — Encounter: Payer: Self-pay | Admitting: Podiatry

## 2015-08-04 ENCOUNTER — Telehealth: Payer: Self-pay | Admitting: *Deleted

## 2015-08-04 DIAGNOSIS — Z9889 Other specified postprocedural states: Secondary | ICD-10-CM

## 2015-08-04 NOTE — Telephone Encounter (Signed)
08/04/2015 Patient called this morning, Pt states the cast put on yesterday is very uncomfortable, feels like its pulling to the left, pt needs to be changed please.

## 2015-08-04 NOTE — Progress Notes (Signed)
Patient came in with walking cast, complaining of pressure on the side of cast on right foot.  Noted of small area of pressing bump on top medial aspect of the cast, 5 cm distal from the top margin.  Bulging plaster at inner wall flattened. Added 1/4" felt and 1/8" felt pad along the peripheral rim of the cast top edge ensure sufficient padding.   Patient stated that the foot feels find and not more pressure. Advised to return if any problem with the cast.

## 2015-08-04 NOTE — Patient Instructions (Signed)
Pressing area of cast flattened and padded. Problem resolved.  Return sooner then the next appointment if needed.

## 2015-08-24 ENCOUNTER — Ambulatory Visit (INDEPENDENT_AMBULATORY_CARE_PROVIDER_SITE_OTHER): Payer: 59 | Admitting: Podiatry

## 2015-08-24 ENCOUNTER — Encounter: Payer: Self-pay | Admitting: Podiatry

## 2015-08-24 DIAGNOSIS — M21969 Unspecified acquired deformity of unspecified lower leg: Secondary | ICD-10-CM | POA: Diagnosis not present

## 2015-08-24 DIAGNOSIS — M79606 Pain in leg, unspecified: Secondary | ICD-10-CM

## 2015-08-24 NOTE — Progress Notes (Signed)
6 weeks post op following ORIF (07/14/15) with staple for displaced bone graft in cotton osteotomy with graft right foot.  Patient denies any discomfort while in cast.  Cast removed. Skin edges well coapted. No edema or erythema noted.   Right foot post op X-ray taken.  There is no change from the last x-ray. Good bone graft position with staple in place. Normal post op x-ray.  Left lower limb placed in CAM walker. Placed OTC orthotics in CAM walker to support instep area.  Do light ambulation with walker. May remove at night. Return in 3 weeks

## 2015-08-24 NOTE — Patient Instructions (Signed)
6 weeks post op wound healing normal without complication. Continue light ambulation with CAM walker. Return in 3 weeks.

## 2015-08-31 ENCOUNTER — Telehealth: Payer: Self-pay | Admitting: *Deleted

## 2015-08-31 NOTE — Telephone Encounter (Signed)
08/31/2015 PT. CALLED AND LEFT A MESSAGE TODAY AND ASK CAN SHE SOAK HER FOOT IN EPSOM SALTS, HER FOOT IS TENDER ON THE TOP AND WANTS TO KNOW WILL THIS BE OK.

## 2015-09-14 ENCOUNTER — Ambulatory Visit (INDEPENDENT_AMBULATORY_CARE_PROVIDER_SITE_OTHER): Payer: 59 | Admitting: Podiatry

## 2015-09-14 ENCOUNTER — Encounter: Payer: Self-pay | Admitting: Podiatry

## 2015-09-14 DIAGNOSIS — Z9889 Other specified postprocedural states: Secondary | ICD-10-CM

## 2015-09-14 NOTE — Progress Notes (Signed)
8 weeks post op following ORIF (07/14/15) with staple for displaced bone graft in cotton osteotomy with graft right foot.  Patient is wearing slippers and denies any discomfort while in cast.  Good first ray correction with sufficient plantar flexion.  Able to wear regular shoes and has no discomfort to ambulate.  Continue in lace up shoes with Orthotics and increase activity as tolerated. May return to work on Monday, 09/18/15.

## 2015-09-14 NOTE — Patient Instructions (Signed)
8 weeks following the 2nd surgery. Recovering well. OK to return to work on Monday, 09/18/15.  Return as needed.

## 2016-05-02 ENCOUNTER — Ambulatory Visit (INDEPENDENT_AMBULATORY_CARE_PROVIDER_SITE_OTHER): Payer: 59 | Admitting: Podiatry

## 2016-05-02 DIAGNOSIS — M79672 Pain in left foot: Secondary | ICD-10-CM

## 2016-05-02 DIAGNOSIS — R234 Changes in skin texture: Secondary | ICD-10-CM

## 2016-05-02 DIAGNOSIS — M79605 Pain in left leg: Secondary | ICD-10-CM

## 2016-05-02 DIAGNOSIS — Q828 Other specified congenital malformations of skin: Secondary | ICD-10-CM | POA: Diagnosis not present

## 2016-05-02 DIAGNOSIS — M21969 Unspecified acquired deformity of unspecified lower leg: Secondary | ICD-10-CM

## 2016-05-02 NOTE — Patient Instructions (Signed)
Seen for painful lesion left foot. All keratotic lesions debrided on both feet. Return in 2 month.

## 2016-05-02 NOTE — Progress Notes (Signed)
Subjective:  51 y.o. year old female patient presents complaining of painful calluses under ball of left (5th MPJ) HPI: Right foot Cotton osteotomy with bone graft 06/16/15.  Objective: Dermatologic: Deep circular porokeratotic lesion under 5th MPJ left. Eschar with porokeratosis under 3rd MPJ right foot. Multiple circular porokeratosis plantar bilateral.  Vascular: Pedal pulses are all palpable.  Orthopedic: Contracted lesser digits with digital corn at distal end on right foot.  Hypermobile first ray with bunion deformity bilateral. Neurologic: All epicritic and tactile sensations grossly intact.   Assessment:  Plantar porokeratosis bilateral Sub 5th MPJ left, Sub 3rd MPJ right.  Plan: Painful lesions debrided.  Return in 2 month for palliation.

## 2016-05-03 ENCOUNTER — Encounter: Payer: Self-pay | Admitting: Podiatry

## 2016-05-07 IMAGING — CR DG HIP (WITH OR WITHOUT PELVIS) 2-3V*R*
3 series · 3 of 3 positions shown · non-contrast
Comparison: 06/10/2012

CLINICAL DATA: Anterior right hip and thigh pain 2 weeks. No
injury.

EXAM:
DG HIP (WITH OR WITHOUT PELVIS) 2-3V RIGHT

[w pelvis upright]
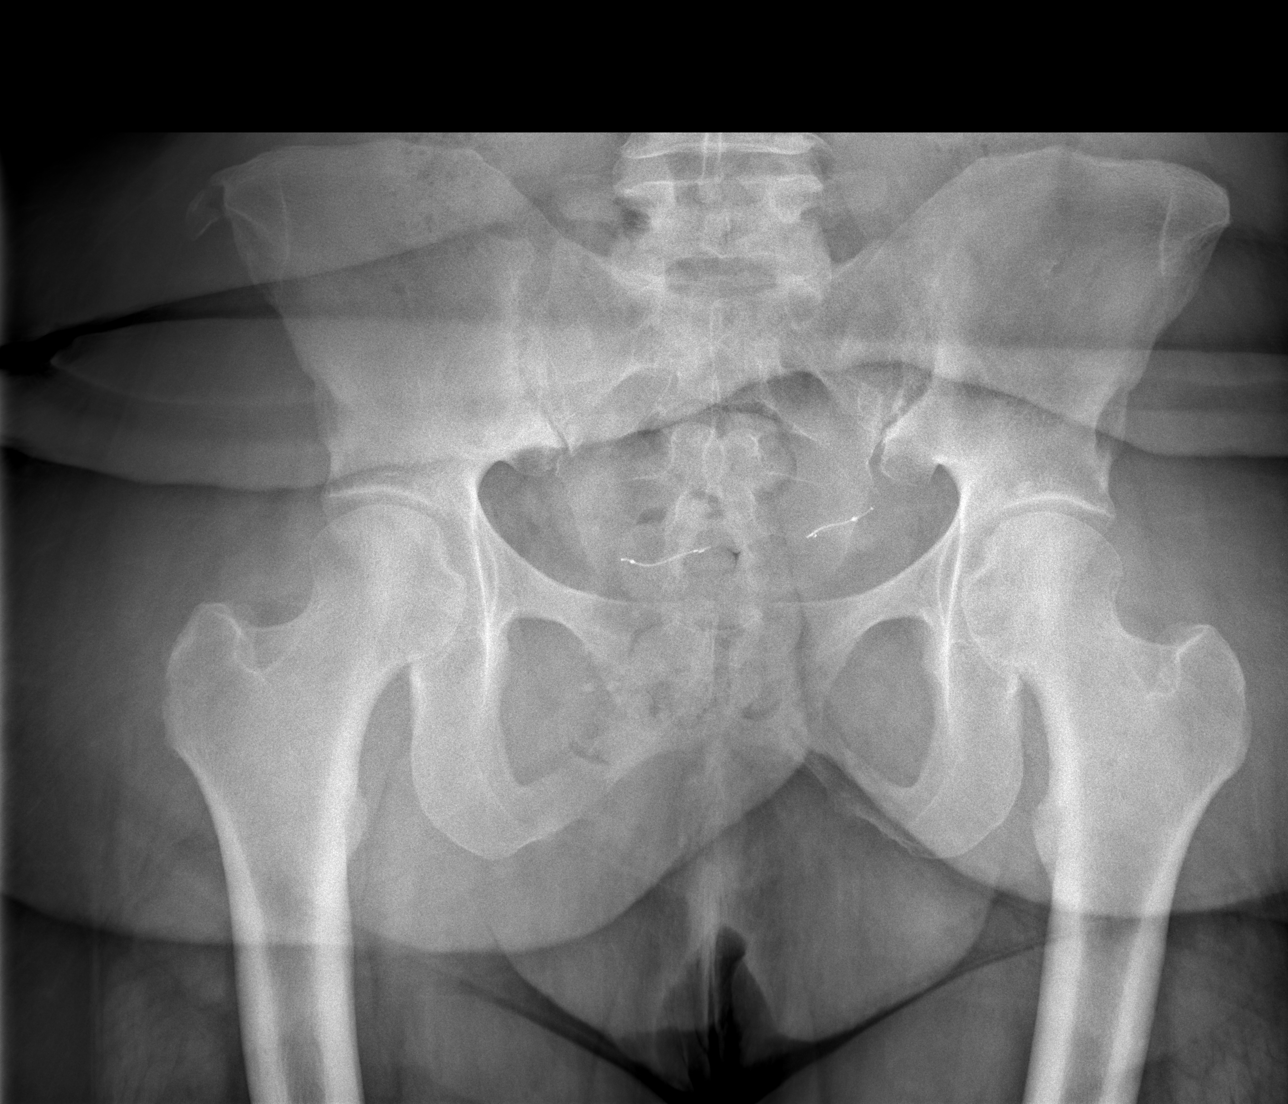

[w hip ap right]
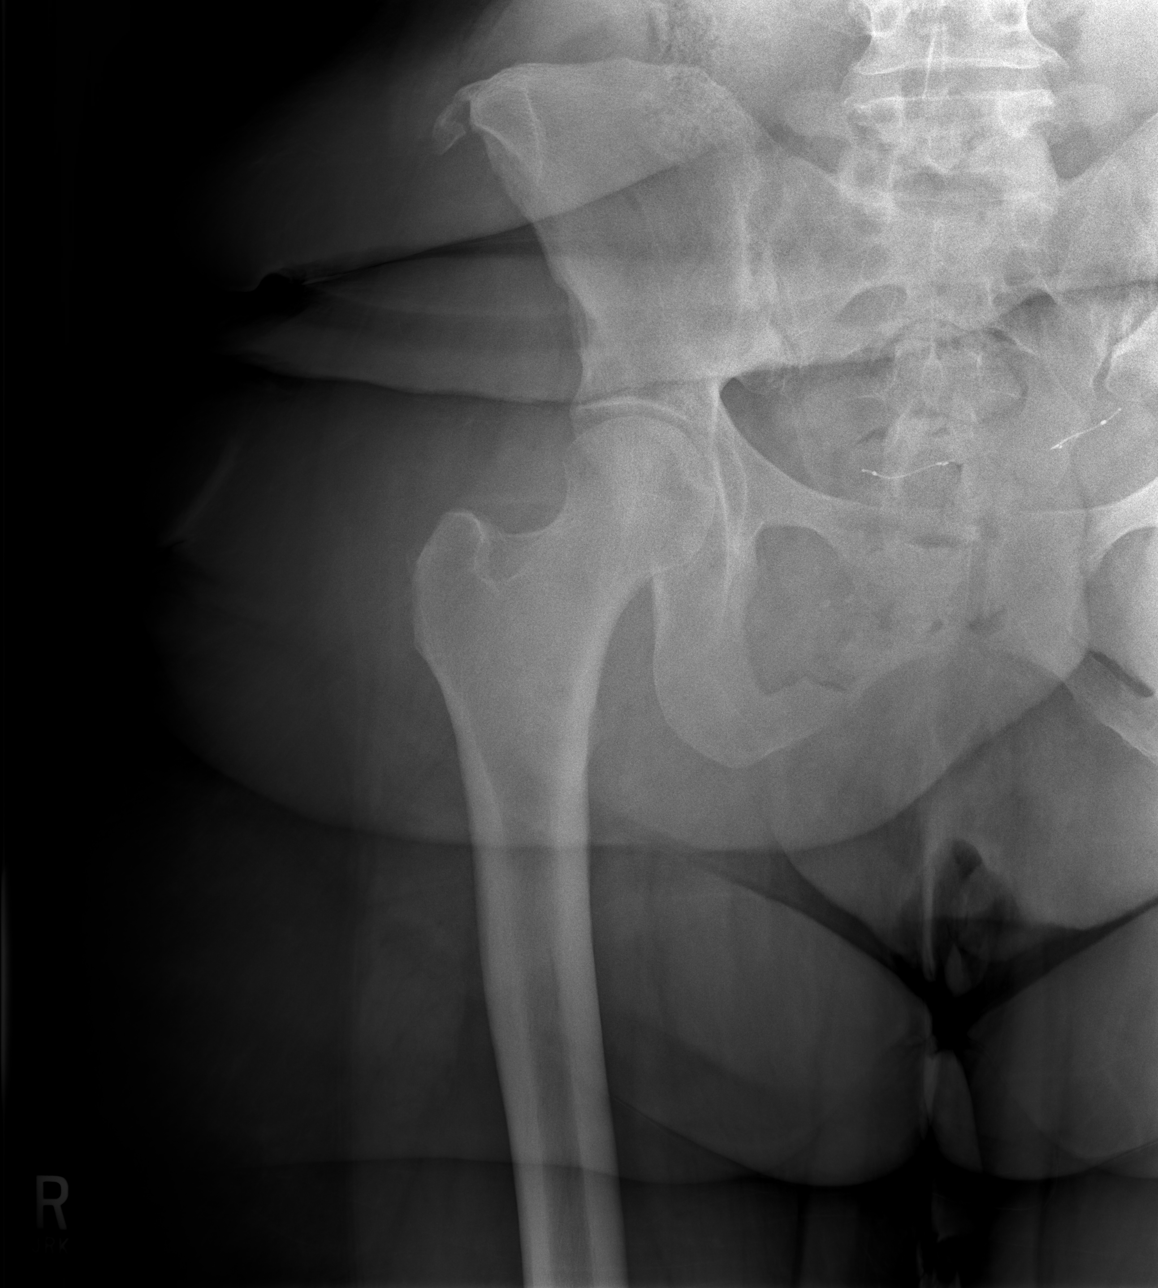

[w hip frog right]
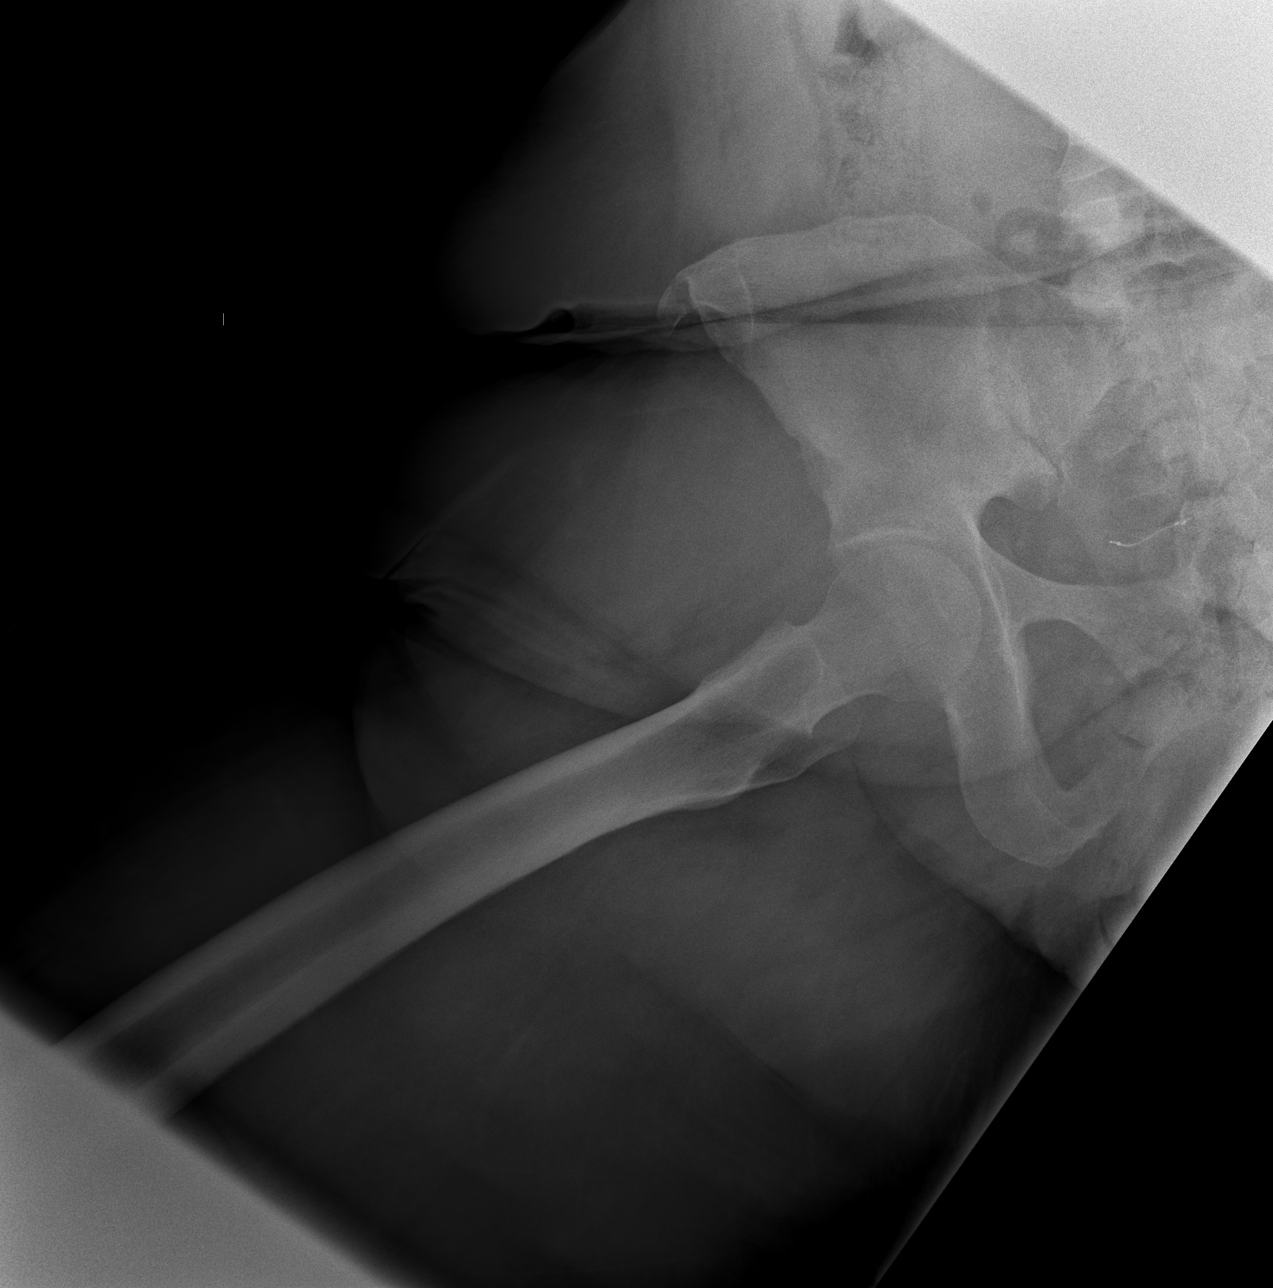

[3 of 3 positions shown; findings below may reference images not displayed]

FINDINGS: Examination demonstrates very minimal symmetric degenerative change
of the hips. There is no acute fracture or dislocation. Mild
degenerate change of the sacroiliac joints and spine are present.
Bilateral tubal ligation with Essure device present.
IMPRESSION: No acute findings.

## 2016-06-30 ENCOUNTER — Emergency Department (HOSPITAL_COMMUNITY)
Admission: EM | Admit: 2016-06-30 | Discharge: 2016-06-30 | Disposition: A | Discharge status: Home / Self Care | Payer: 59 | Attending: Emergency Medicine | Admitting: Emergency Medicine

## 2016-06-30 ENCOUNTER — Encounter (HOSPITAL_COMMUNITY): Payer: Self-pay | Admitting: Emergency Medicine

## 2016-06-30 ENCOUNTER — Emergency Department (HOSPITAL_COMMUNITY): Payer: 59

## 2016-06-30 DIAGNOSIS — Y9269 Other specified industrial and construction area as the place of occurrence of the external cause: Secondary | ICD-10-CM | POA: Diagnosis not present

## 2016-06-30 DIAGNOSIS — Z79899 Other long term (current) drug therapy: Secondary | ICD-10-CM | POA: Diagnosis not present

## 2016-06-30 DIAGNOSIS — Y9389 Activity, other specified: Secondary | ICD-10-CM | POA: Insufficient documentation

## 2016-06-30 DIAGNOSIS — M25511 Pain in right shoulder: Secondary | ICD-10-CM | POA: Insufficient documentation

## 2016-06-30 DIAGNOSIS — X500XXA Overexertion from strenuous movement or load, initial encounter: Secondary | ICD-10-CM | POA: Insufficient documentation

## 2016-06-30 DIAGNOSIS — Y990 Civilian activity done for income or pay: Secondary | ICD-10-CM | POA: Insufficient documentation

## 2016-06-30 DIAGNOSIS — S4991XA Unspecified injury of right shoulder and upper arm, initial encounter: Secondary | ICD-10-CM | POA: Diagnosis present

## 2016-06-30 MED ORDER — NAPROXEN 500 MG PO TABS: 500.0000 mg | ORAL_TABLET | Freq: Two times a day (BID) | ORAL | 0 refills | Status: DC

## 2016-06-30 NOTE — ED Triage Notes (Signed)
Patient c/o right shoulder pain that started on Friday. patient works in [employer] and does lifting. Patient took OTC meds and hydrocodone that she had previously and no relief. Patient states that has difficulty raising right arm.

## 2016-06-30 NOTE — ED Provider Notes (Signed)
[facility]-EMERGENCY DEPT Provider Note   CSN: [id number] Arrival date & time: 06/30/16  1035   By signing my name below, I, Soijett Blue, attest that this documentation has been prepared under the direction and in the presence of Burna Forts, PA-C Electronically Signed: Soijett Blue, ED Scribe. 06/30/16. 11:54 AM.  History   Chief Complaint Chief Complaint  Patient presents with  . Shoulder Pain    HPI Emma Gonzales is a 51 y.o. female who presents to the Emergency Department complaining of right shoulder pain onset 2 days ago. Pt right shoulder pain is worsened with movement and alleviated with rest. Pt has not tried any medications for the relief of her symptoms. Pt reports that she has a hx of right shoulder pain but never as painful as this current episode. She states that she works in [employer]	[place] and does heavy lifting. She denies neck pain, fever, numbness, tingling, recent trauma, recent injury, and any other symptoms.    The history is provided by the patient. No language interpreter was used.    Past Medical History:  Diagnosis Date  . Anxiety attack 01-24-11   previoulsy and only once  . GERD (gastroesophageal reflux disease) 01-24-11   reflux with hiatal hernia-no regular meds  . Hiatal hernia 01-24-11   hx. of this recent dx./no nerve issues  . Leg pain   . Obesity   . PONV (postoperative nausea and vomiting) 01-24-11   pt. has PONV with anesthesia occ.  . Sleep apnea 01-24-11   no cpap used  . Varicose vein of leg    BILATERAL    Patient Active Problem List   Diagnosis Date Noted  . Porokeratosis 07/07/2013  . Pain in lower limb 07/07/2013  . Eschar of foot 07/07/2013  . S/P umbilical hernia repair, follow-up exam 01/08/2013  . PAC (premature atrial contraction) 02/05/2012  . Lap gastric bypass Nov 2012 06/19/2011  . Palpitations 06/14/2011  . History of sleep apnea 06/14/2011  . Obesity 10/17/2010    Past Surgical History:  Procedure  Laterality Date  . CHOLECYSTECTOMY  01-24-11   '96-Laparoscopic  . Cotton Osteotomy w/ Bone Graft Right 06/16/2015  . ENDOMETRIAL ABLATION W/ NOVASURE    . FOOT SURGERY  01-24-11   bilateral  . GASTRIC ROUX-EN-Y  01/28/2011   Procedure: LAPAROSCOPIC ROUX-EN-Y GASTRIC;  Surgeon: Valarie Merino, MD;  Location: [facility] ORS;  Service: General;  Laterality: N/A;  With upper endoscopy  . ORIF METATARSAL FRACTURE Right 07/14/2015  . TUBAL LIGATION    . UMBILICAL HERNIA REPAIR N/A 12/23/2012   Procedure: LAPAROSCOPY  REPAIR  UMBILICAL HERNIA, CLOSURE OF INTERNAL HERNIA DEFECT;  Surgeon: Valarie Merino, MD;  Location: [facility] ORS;  Service: General;  Laterality: N/A;    OB History    No data available       Home Medications    Prior to Admission medications   Medication Sig Start Date End Date Taking? Authorizing Provider  ALPRAZolam Prudy Feeler) 0.5 MG tablet One po  bid prn anxiety 07/18/14   [name], MD  calcium-vitamin D (OSCAL WITH D) 500-200 MG-UNIT per tablet Take 2 tablets by mouth daily with breakfast.    Historical Provider, MD  Hydrocodone-Acetaminophen 7.5-300 MG TABS Take 1 tablet by mouth every 6 (six) hours as needed. 07/13/15   Myeong Christianne Dolin, DPM  Multiple Vitamins-Minerals (MULTIVITAMIN WITH MINERALS) tablet Take 2 tablets by mouth daily.     Historical Provider, MD  nabumetone (RELAFEN) 500 MG tablet Take  1 tablet (500 mg total) by mouth 2 (two) times daily. 07/13/15   Myeong O Sheard, DPM  naproxen (NAPROSYN) 500 MG tablet Take 1 tablet (500 mg total) by mouth 2 (two) times daily. 06/30/16   Eyvonne Mechanic, PA-C  predniSONE (DELTASONE) 10 MG tablet Take in tapering course as directed 11/22/14   [name], MD  vitamin B-12 (CYANOCOBALAMIN) 50 MCG tablet Take 50 mcg by mouth daily.    Historical Provider, MD    Family History Family History  Problem Relation Age of Onset  . Diabetes Mother   . Heart disease Mother     Not sure what type of heart disease  . Hyperlipidemia  Father   . Heart attack Father   . Diabetes Brother     Social History Social History  Substance Use Topics  . Smoking status: Never Smoker  . Smokeless tobacco: Never Used  . Alcohol use 0.0 oz/week     Comment: OCCASIONAL     Allergies   Amoxicillin   Review of Systems Review of Systems  Constitutional: Negative for fever.  Musculoskeletal: Positive for arthralgias (right shoulder). Negative for neck pain.  Neurological: Negative for numbness.       No tingling     Physical Exam Updated Vital Signs BP 132/72 (BP Location: Left Arm)   Pulse (!) 56   Temp 97.9 F (36.6 C) (Oral)   Resp 16   LMP 06/25/2016   SpO2 100%   Physical Exam  Constitutional: She is oriented to person, place, and time. She appears well-developed and well-nourished. No distress.  HENT:  Head: Normocephalic and atraumatic.  Eyes: EOM are normal.  Neck: Neck supple.  Cardiovascular: Normal rate.   Pulses:      Radial pulses are 2+ on the right side.  Pulmonary/Chest: Effort normal. No respiratory distress.  Abdominal: She exhibits no distension.  Musculoskeletal: Normal range of motion.       Right shoulder: She exhibits no tenderness and no bony tenderness.  Non-TTP. Distal sensation, strength, and motor function intact. Radial pulse 2+. Very minor discomfort with forward flexion and empty can test. No significant pain with internal or external rotation. No crepitus noted.   Neurological: She is alert and oriented to person, place, and time.  Skin: Skin is warm and dry.  Psychiatric: She has a normal mood and affect. Her behavior is normal.  Nursing note and vitals reviewed.    ED Treatments / Results  DIAGNOSTIC STUDIES: Oxygen Saturation is 100% on RA, nl by my interpretation.    COORDINATION OF CARE: 11:58 AM Discussed treatment plan with pt at bedside which includes right shoulder xray, anti-inflammatory Rx, and pt agreed to plan.   Radiology Dg Shoulder Right  Result  Date: 06/30/2016 CLINICAL DATA:  Right shoulder pain for the past 2 days, lifting at work. No specific injury. EXAM: RIGHT SHOULDER - 2+ VIEW COMPARISON:  None. FINDINGS: Mild inferior acromioclavicular spur formation and mild inferior glenoid spur formation. Mild inferior glenoid cystic changes. No fracture or dislocation. IMPRESSION: Mild degenerative changes. These include mild inferior acromioclavicular spur formation with possible rotator cuff impingement. Electronically Signed   By: Beckie Salts M.D.   On: 06/30/2016 11:20    Procedures Procedures (including critical care time)  Medications Ordered in ED Medications - No data to display   Initial Impression / Assessment and Plan / ED Course  I have reviewed the triage vital signs and the nursing notes.  Pertinent imaging results that were available  during my care of the patient were reviewed by me and considered in my medical decision making (see chart for details).      Labs:   Imaging: right shoulder xray  Consults:  Therapeutics:  Discharge Meds:   Assessment/Plan: Patient presents with right shoulder pain, likely overuse. No infectious etiology. X-ray significant for "Mild degenerative changes. These include mild inferior acromioclavicular spur formation with possible rotator cuff impingement." Discharged home with symptomatic care instructions. Pt provided with referral and informed to follow up with orthopedist if symptoms persist. Patient will be discharged home & is agreeable with above plan. Returns precautions discussed. Pt appears safe for discharge.  Final Clinical Impressions(s) / ED Diagnoses   Final diagnoses:  Acute pain of right shoulder    New Prescriptions Discharge Medication List as of 06/30/2016 11:57 AM    START taking these medications   Details  naproxen (NAPROSYN) 500 MG tablet Take 1 tablet (500 mg total) by mouth 2 (two) times daily., Starting Sun 06/30/2016, Print       I personally  performed the services described in this documentation, which was scribed in my presence. The recorded information has been reviewed and is accurate.    Eyvonne Mechanic, PA-C 06/30/16 1816    Arby Barrette, MD 07/01/16 [phone]

## 2016-06-30 NOTE — Discharge Instructions (Signed)
Please read attached information. If you experience any new or worsening signs or symptoms please return to the emergency room for evaluation. Please follow-up with your primary care provider or specialist as discussed. Please use medication prescribed only as directed and discontinue taking if you have any concerning signs or symptoms.   °

## 2016-08-07 DIAGNOSIS — N62 Hypertrophy of breast: Secondary | ICD-10-CM | POA: Diagnosis not present

## 2016-08-13 DIAGNOSIS — N6320 Unspecified lump in the left breast, unspecified quadrant: Secondary | ICD-10-CM | POA: Diagnosis not present

## 2016-08-29 ENCOUNTER — Encounter (HOSPITAL_COMMUNITY): Payer: Self-pay

## 2016-08-29 DIAGNOSIS — N6324 Unspecified lump in the left breast, lower inner quadrant: Secondary | ICD-10-CM | POA: Diagnosis not present

## 2016-08-29 DIAGNOSIS — N6322 Unspecified lump in the left breast, upper inner quadrant: Secondary | ICD-10-CM | POA: Diagnosis not present

## 2016-09-12 ENCOUNTER — Ambulatory Visit (INDEPENDENT_AMBULATORY_CARE_PROVIDER_SITE_OTHER): Payer: 59 | Admitting: Podiatry

## 2016-09-12 ENCOUNTER — Encounter: Payer: Self-pay | Admitting: Podiatry

## 2016-09-12 DIAGNOSIS — R234 Changes in skin texture: Secondary | ICD-10-CM

## 2016-09-12 DIAGNOSIS — Q828 Other specified congenital malformations of skin: Secondary | ICD-10-CM | POA: Diagnosis not present

## 2016-09-12 DIAGNOSIS — M79606 Pain in leg, unspecified: Secondary | ICD-10-CM | POA: Diagnosis not present

## 2016-09-12 DIAGNOSIS — M21969 Unspecified acquired deformity of unspecified lower leg: Secondary | ICD-10-CM | POA: Diagnosis not present

## 2016-09-12 NOTE — Progress Notes (Signed)
Subjective: 51 y.o. year old female patient presents complaining of painful calluses under ball of left (5th MPJ) Stated that trimming relieve her pain for about a month and a half. HPI: Right foot Cotton osteotomy with bone graft 06/16/15.  Objective: Dermatologic:Deep circular porokeratotic lesion under 5th MPJ left. Eschar with porokeratosis under 3rd MPJ right foot. Multiple circular porokeratosis plantar bilateral.  Vascular:Pedal pulses are all palpable.  Orthopedic:Contracted lesser digits with digital corn at distal end on right foot.  Hypermobile first ray with bunion deformity bilateral. Neurologic:All epicritic and tactile sensations grossly intact.   Assessment: Plantar porokeratosis bilateral Sub 5th MPJ left, Sub 3rd MPJ right.  Plan: Painful lesions debrided.  Return in 2 month for palliation.

## 2016-09-12 NOTE — Patient Instructions (Signed)
Seen for painful callus on both feet. All lesions debrided. Return in 2 months.

## 2016-11-13 ENCOUNTER — Ambulatory Visit: Payer: 59 | Admitting: Podiatry

## 2016-11-21 DIAGNOSIS — M170 Bilateral primary osteoarthritis of knee: Secondary | ICD-10-CM | POA: Diagnosis not present

## 2016-12-10 DIAGNOSIS — Z23 Encounter for immunization: Secondary | ICD-10-CM | POA: Diagnosis not present

## 2016-12-25 ENCOUNTER — Ambulatory Visit (INDEPENDENT_AMBULATORY_CARE_PROVIDER_SITE_OTHER): Payer: 59

## 2016-12-25 ENCOUNTER — Ambulatory Visit (INDEPENDENT_AMBULATORY_CARE_PROVIDER_SITE_OTHER): Payer: 59 | Admitting: Orthopaedic Surgery

## 2016-12-25 DIAGNOSIS — G8929 Other chronic pain: Secondary | ICD-10-CM

## 2016-12-25 DIAGNOSIS — M1712 Unilateral primary osteoarthritis, left knee: Secondary | ICD-10-CM

## 2016-12-25 DIAGNOSIS — M25562 Pain in left knee: Secondary | ICD-10-CM | POA: Diagnosis not present

## 2016-12-25 DIAGNOSIS — Z0000 Encounter for general adult medical examination without abnormal findings: Secondary | ICD-10-CM | POA: Diagnosis not present

## 2016-12-25 MED ORDER — LIDOCAINE HCL 1 % IJ SOLN
3.0000 mL | INTRAMUSCULAR | Status: AC | PRN
  Administered 2016-12-25: 3 mL

## 2016-12-25 MED ORDER — NABUMETONE 500 MG PO TABS: 500.0000 mg | ORAL_TABLET | Freq: Two times a day (BID) | ORAL | 3 refills | Status: DC | PRN

## 2016-12-25 MED ORDER — METHYLPREDNISOLONE ACETATE 40 MG/ML IJ SUSP
40.0000 mg | INTRAMUSCULAR | Status: AC | PRN
  Administered 2016-12-25: 40 mg via INTRA_ARTICULAR

## 2016-12-25 NOTE — Progress Notes (Signed)
Office Visit Note   Patient: Emma Gonzales           Date of Birth: May 31, 1965           MRN: [id number] Visit Date: 12/25/2016              Requested by: [name], MD [address] [place], [place] [address] PCP: [name], MD   Assessment & Plan: Visit Diagnoses:  1. Chronic pain of left knee   2. Unilateral primary osteoarthritis, left knee     Plan: We went over her knee x-rays and knee model and explained in detail with the arthritis in her knee is causing making her feel. She did wish to try steroid injection in her knee and I agree with this. She is a perfect candidate for hyaluronic acid as well. We'll order this for as well. Given the valgus malalignment and the significant arthritis she may end up needing a knee replacement in the near future. We will try conservative treatment first and she understands this as well. She'll work on Advice worker exercises as well as activity modification and weight loss.  Follow-Up Instructions: Return in about 4 weeks (around 01/22/2017).   Orders:  Orders Placed This Encounter  Procedures  . Large Joint Injection/Arthrocentesis  . XR Knee 1-2 Views Left   Meds ordered this encounter  Medications  . nabumetone (RELAFEN) 500 MG tablet    Sig: Take 1 tablet (500 mg total) by mouth 2 (two) times daily as needed.    Dispense:  60 tablet    Refill:  3      Procedures: Large Joint Inj Date/Time: 12/25/2016 11:23 AM Performed by: Kathryne Hitch Authorized by: Kathryne Hitch   Location:  Knee Site:  L knee Ultrasound Guidance: No   Fluoroscopic Guidance: No   Arthrogram: No   Medications:  3 mL lidocaine 1 %; 40 mg methylPREDNISolone acetate 40 MG/ML     Clinical Data: No additional findings.   Subjective: No chief complaint on file. She is someone who is not been seen along time. She comes in for chief complaint of left knee pain is been getting slowly worse over the last several  months. She denies any locking catching but really flared up for her last week. She is taken Relafen in the past and would like a refill this today. She had an injection in that left knee years ago. She points lateral aspect of that knee as a source of her pain. There is not been any locking catching that is aching quite a bit especially when she is at her job in her feet all day long.  HPI  Review of Systems She currently denies any headache, chest pain, shortness of breath, fever, chills, nausea, vomiting.  Objective: Vital Signs: There were no vitals taken for this visit.  Physical Exam He is alert and oriented 3 and in no acute distress Ortho Exam Examination of her left knee shows a significant valgus deformity. She is obese but her right knee does not have any significant valgus malalignment. The left knee has patellofemoral crepitation and significant lateral joint line tenderness. Her range of motion is full. Specialty Comments:  No specialty comments available.  Imaging: Xr Knee 1-2 Views Left  Result Date: 12/25/2016 2 views of the left knee shows slight valgus malalignment with severe lateral compartment arthritis and moderate patella femoral arthritis    PMFS History: Patient Active Problem List   Diagnosis Date Noted  .  Chronic pain of left knee 12/25/2016  . Unilateral primary osteoarthritis, left knee 12/25/2016  . Porokeratosis 07/07/2013  . Pain in lower limb 07/07/2013  . Eschar of foot 07/07/2013  . S/P umbilical hernia repair, follow-up exam 01/08/2013  . PAC (premature atrial contraction) 02/05/2012  . Lap gastric bypass Nov 2012 06/19/2011  . Palpitations 06/14/2011  . History of sleep apnea 06/14/2011  . Obesity 10/17/2010   Past Medical History:  Diagnosis Date  . Anxiety attack 01-24-11   previoulsy and only once  . GERD (gastroesophageal reflux disease) 01-24-11   reflux with hiatal hernia-no regular meds  . Hiatal hernia 01-24-11   hx. of  this recent dx./no nerve issues  . Leg pain   . Obesity   . PONV (postoperative nausea and vomiting) 01-24-11   pt. has PONV with anesthesia occ.  . Sleep apnea 01-24-11   no cpap used  . Varicose vein of leg    BILATERAL    Family History  Problem Relation Age of Onset  . Diabetes Mother   . Heart disease Mother        Not sure what type of heart disease  . Hyperlipidemia Father   . Heart attack Father   . Diabetes Brother     Past Surgical History:  Procedure Laterality Date  . CHOLECYSTECTOMY  01-24-11   '96-Laparoscopic  . Cotton Osteotomy w/ Bone Graft Right 06/16/2015  . ENDOMETRIAL ABLATION W/ NOVASURE    . FOOT SURGERY  01-24-11   bilateral  . GASTRIC ROUX-EN-Y  01/28/2011   Procedure: LAPAROSCOPIC ROUX-EN-Y GASTRIC;  Surgeon: Valarie MerinoMatthew B Martin, MD;  Location: [facility];  Service: General;  Laterality: N/A;  With upper endoscopy  . ORIF METATARSAL FRACTURE Right 07/14/2015  . TUBAL LIGATION    . UMBILICAL HERNIA REPAIR N/A 12/23/2012   Procedure: LAPAROSCOPY  REPAIR  UMBILICAL HERNIA, CLOSURE OF INTERNAL HERNIA DEFECT;  Surgeon: Valarie MerinoMatthew B Martin, MD;  Location: [facility];  Service: General;  Laterality: N/A;   Social History   Occupational History  . ORDER PROCESSOR [employer]   Social History Main Topics  . Smoking status: Never Smoker  . Smokeless tobacco: Never Used  . Alcohol use 0.0 oz/week     Comment: OCCASIONAL  . Drug use: No  . Sexual activity: Yes

## 2017-01-14 ENCOUNTER — Ambulatory Visit: Payer: 59 | Admitting: Podiatry

## 2017-01-16 ENCOUNTER — Ambulatory Visit (INDEPENDENT_AMBULATORY_CARE_PROVIDER_SITE_OTHER): Payer: 59 | Admitting: Podiatry

## 2017-01-16 ENCOUNTER — Encounter: Payer: Self-pay | Admitting: Podiatry

## 2017-01-16 DIAGNOSIS — M79606 Pain in leg, unspecified: Secondary | ICD-10-CM

## 2017-01-16 DIAGNOSIS — M21969 Unspecified acquired deformity of unspecified lower leg: Secondary | ICD-10-CM | POA: Diagnosis not present

## 2017-01-16 DIAGNOSIS — R234 Changes in skin texture: Secondary | ICD-10-CM

## 2017-01-16 DIAGNOSIS — Q828 Other specified congenital malformations of skin: Secondary | ICD-10-CM | POA: Diagnosis not present

## 2017-01-16 NOTE — Patient Instructions (Signed)
Seen for painful calluses. All lesions debrided. Return in 2 month or as needed.

## 2017-01-16 NOTE — Progress Notes (Signed)
Subjective: 51 y.o. year old female patient presents complaining of painful calluses under left foot.  Hx of right foot cotton osteotomy with bone graft 06/16/15. Patient was satisfied with the result.  Objective: Dermatologic: Painful porokeratosis with eschar under 3rd MPJ right, under 5th MPJ left. Multiple plantar porokeratosis bilateral. Vascular: Pedal pulses are all palpable. Orthopedic: Contracted lesser digits 2-5 bilateral. Hypermobile first ray bilateral. Neurologic: All epicritic and tactile sensations grossly intact.  Assessment: Painful plantar porokeratosis sub 5 MPJ left, under 3rd MPJ right.  Treatment: All lesions debrided.  Return in 2 month for palliation.

## 2017-01-22 ENCOUNTER — Ambulatory Visit (INDEPENDENT_AMBULATORY_CARE_PROVIDER_SITE_OTHER): Payer: 59 | Admitting: Orthopaedic Surgery

## 2017-01-27 DIAGNOSIS — Z1211 Encounter for screening for malignant neoplasm of colon: Secondary | ICD-10-CM | POA: Diagnosis not present

## 2017-02-27 DIAGNOSIS — Z1211 Encounter for screening for malignant neoplasm of colon: Secondary | ICD-10-CM | POA: Diagnosis not present

## 2017-02-27 DIAGNOSIS — Z01818 Encounter for other preprocedural examination: Secondary | ICD-10-CM | POA: Diagnosis not present

## 2017-03-10 DIAGNOSIS — N62 Hypertrophy of breast: Secondary | ICD-10-CM | POA: Diagnosis not present

## 2017-03-10 DIAGNOSIS — Z719 Counseling, unspecified: Secondary | ICD-10-CM | POA: Diagnosis not present

## 2017-03-24 DIAGNOSIS — E881 Lipodystrophy, not elsewhere classified: Secondary | ICD-10-CM | POA: Diagnosis not present

## 2017-03-24 DIAGNOSIS — N6481 Ptosis of breast: Secondary | ICD-10-CM | POA: Diagnosis not present

## 2017-03-24 DIAGNOSIS — Z01810 Encounter for preprocedural cardiovascular examination: Secondary | ICD-10-CM | POA: Diagnosis not present

## 2017-03-24 DIAGNOSIS — I493 Ventricular premature depolarization: Secondary | ICD-10-CM | POA: Diagnosis not present

## 2017-03-24 DIAGNOSIS — R001 Bradycardia, unspecified: Secondary | ICD-10-CM | POA: Diagnosis not present

## 2017-04-01 DIAGNOSIS — Z01419 Encounter for gynecological examination (general) (routine) without abnormal findings: Secondary | ICD-10-CM | POA: Diagnosis not present

## 2017-04-17 DIAGNOSIS — Z0000 Encounter for general adult medical examination without abnormal findings: Secondary | ICD-10-CM | POA: Diagnosis not present

## 2017-04-17 DIAGNOSIS — R9431 Abnormal electrocardiogram [ECG] [EKG]: Secondary | ICD-10-CM | POA: Diagnosis not present

## 2017-04-17 DIAGNOSIS — I499 Cardiac arrhythmia, unspecified: Secondary | ICD-10-CM | POA: Diagnosis not present

## 2017-04-25 DIAGNOSIS — I499 Cardiac arrhythmia, unspecified: Secondary | ICD-10-CM | POA: Diagnosis not present

## 2017-04-25 DIAGNOSIS — R9431 Abnormal electrocardiogram [ECG] [EKG]: Secondary | ICD-10-CM | POA: Diagnosis not present

## 2017-05-06 DIAGNOSIS — R9431 Abnormal electrocardiogram [ECG] [EKG]: Secondary | ICD-10-CM | POA: Diagnosis not present

## 2017-05-06 DIAGNOSIS — I499 Cardiac arrhythmia, unspecified: Secondary | ICD-10-CM | POA: Diagnosis not present

## 2017-05-22 ENCOUNTER — Ambulatory Visit (INDEPENDENT_AMBULATORY_CARE_PROVIDER_SITE_OTHER): Payer: 59 | Admitting: Podiatry

## 2017-05-22 ENCOUNTER — Encounter: Payer: Self-pay | Admitting: Podiatry

## 2017-05-22 DIAGNOSIS — R234 Changes in skin texture: Secondary | ICD-10-CM | POA: Diagnosis not present

## 2017-05-22 DIAGNOSIS — Q828 Other specified congenital malformations of skin: Secondary | ICD-10-CM | POA: Diagnosis not present

## 2017-05-22 NOTE — Patient Instructions (Signed)
Seen for painful calluses. All lesions debrided. Return as needed.  

## 2017-05-22 NOTE — Progress Notes (Signed)
Subjective: 52 y.o. year old female patient presents complaining of painful feet. She is on feet all day. Hx of Cotton osteotomy with graft right 06/16/15.  Objective: Dermatologic: Thick painful porokeratosis under the MPJ left foot, under 3rd MPJ right. Vascular: Pedal pulses are all palpable. Orthopedic: Contracted lesser digits 2-5 bilateral with hypermobile first ray bilateral. Neurologic: All epicritic and tactile sensations grossly intact.  Assessment: Porokeratosis with eschar under 5th MPJ with pain upon ambulation.  Treatment: All lesions debrided.  Return as needed.

## 2017-06-09 ENCOUNTER — Other Ambulatory Visit: Payer: 59 | Admitting: Internal Medicine

## 2017-06-09 DIAGNOSIS — Z13220 Encounter for screening for lipoid disorders: Secondary | ICD-10-CM

## 2017-06-09 DIAGNOSIS — Z0000 Encounter for general adult medical examination without abnormal findings: Secondary | ICD-10-CM | POA: Diagnosis not present

## 2017-06-09 DIAGNOSIS — Z1321 Encounter for screening for nutritional disorder: Secondary | ICD-10-CM | POA: Diagnosis not present

## 2017-06-09 DIAGNOSIS — Z1329 Encounter for screening for other suspected endocrine disorder: Secondary | ICD-10-CM

## 2017-06-10 LAB — CBC WITH DIFFERENTIAL/PLATELET
Basophils Absolute: 10 cells/uL (ref 0–200)
Basophils Relative: 0.2 %
EOS ABS: 31 {cells}/uL (ref 15–500)
Eosinophils Relative: 0.6 %
HEMATOCRIT: 37.6 % (ref 35.0–45.0)
HEMOGLOBIN: 12 g/dL (ref 11.7–15.5)
LYMPHS ABS: 2163 {cells}/uL (ref 850–3900)
MCH: 27.3 pg (ref 27.0–33.0)
MCHC: 31.9 g/dL — ABNORMAL LOW (ref 32.0–36.0)
MCV: 85.5 fL (ref 80.0–100.0)
MONOS PCT: 10.3 %
MPV: 9.8 fL (ref 7.5–12.5)
NEUTROS ABS: 2460 {cells}/uL (ref 1500–7800)
Neutrophils Relative %: 47.3 %
Platelets: 276 10*3/uL (ref 140–400)
RBC: 4.4 10*6/uL (ref 3.80–5.10)
RDW: 14.4 % (ref 11.0–15.0)
Total Lymphocyte: 41.6 %
WBC mixed population: 536 cells/uL (ref 200–950)
WBC: 5.2 10*3/uL (ref 3.8–10.8)

## 2017-06-10 LAB — LIPID PANEL
CHOL/HDL RATIO: 2.1 (calc) (ref <5.0)
Cholesterol: 162 mg/dL (ref <200)
HDL: 78 mg/dL (ref >50)
LDL Cholesterol (Calc): 71 mg/dL (calc)
NON-HDL CHOLESTEROL (CALC): 84 mg/dL (ref <130)
Triglycerides: 51 mg/dL (ref <150)

## 2017-06-10 LAB — COMPLETE METABOLIC PANEL WITH GFR
AG Ratio: 1.5 (calc) (ref 1.0–2.5)
ALT: 15 U/L (ref 6–29)
AST: 17 U/L (ref 10–35)
Albumin: 3.8 g/dL (ref 3.6–5.1)
Alkaline phosphatase (APISO): 79 U/L (ref 33–130)
BUN: 13 mg/dL (ref 7–25)
CALCIUM: 8.8 mg/dL (ref 8.6–10.4)
CO2: 26 mmol/L (ref 20–32)
CREATININE: 0.99 mg/dL (ref 0.50–1.05)
Chloride: 111 mmol/L — ABNORMAL HIGH (ref 98–110)
GFR, EST AFRICAN AMERICAN: 76 mL/min/{1.73_m2} (ref >=60)
GFR, EST NON AFRICAN AMERICAN: 66 mL/min/{1.73_m2} (ref >=60)
Globulin: 2.5 g/dL (calc) (ref 1.9–3.7)
Glucose, Bld: 74 mg/dL (ref 65–99)
Potassium: 4.7 mmol/L (ref 3.5–5.3)
Sodium: 144 mmol/L (ref 135–146)
TOTAL PROTEIN: 6.3 g/dL (ref 6.1–8.1)
Total Bilirubin: 0.4 mg/dL (ref 0.2–1.2)

## 2017-06-10 LAB — VITAMIN D 25 HYDROXY (VIT D DEFICIENCY, FRACTURES): Vit D, 25-Hydroxy: 28 ng/mL — ABNORMAL LOW (ref 30–100)

## 2017-06-10 LAB — TSH: TSH: 1.11 mIU/L

## 2017-06-13 ENCOUNTER — Encounter: Payer: Self-pay | Admitting: Internal Medicine

## 2017-06-13 ENCOUNTER — Ambulatory Visit (INDEPENDENT_AMBULATORY_CARE_PROVIDER_SITE_OTHER): Payer: 59 | Admitting: Internal Medicine

## 2017-06-13 VITALS — BP 102/82 | HR 69 | Ht 64.0 in | Wt 261.0 lb

## 2017-06-13 DIAGNOSIS — F32A Depression, unspecified: Secondary | ICD-10-CM

## 2017-06-13 DIAGNOSIS — Z9884 Bariatric surgery status: Secondary | ICD-10-CM

## 2017-06-13 DIAGNOSIS — F329 Major depressive disorder, single episode, unspecified: Secondary | ICD-10-CM | POA: Diagnosis not present

## 2017-06-13 DIAGNOSIS — E559 Vitamin D deficiency, unspecified: Secondary | ICD-10-CM

## 2017-06-13 DIAGNOSIS — F419 Anxiety disorder, unspecified: Secondary | ICD-10-CM

## 2017-06-13 DIAGNOSIS — Z0000 Encounter for general adult medical examination without abnormal findings: Secondary | ICD-10-CM

## 2017-06-13 LAB — POCT URINALYSIS DIPSTICK
APPEARANCE: NORMAL
BILIRUBIN UA: NEGATIVE
GLUCOSE UA: NEGATIVE
Ketones, UA: NEGATIVE
Leukocytes, UA: NEGATIVE
Nitrite, UA: NEGATIVE
ODOR: NORMAL
PH UA: 6.5 (ref 5.0–8.0)
Protein, UA: NEGATIVE
Spec Grav, UA: 1.015 (ref 1.010–1.025)
UROBILINOGEN UA: 0.2 U/dL

## 2017-06-13 MED ORDER — ALPRAZOLAM 0.5 MG PO TABS: ORAL_TABLET | ORAL | 0 refills | Status: DC

## 2017-07-06 NOTE — Progress Notes (Signed)
Subjective:    Patient ID: Emma Gonzales, female    DOB: 1965-11-14, 52 y.o.   MRN: [id number]  HPI 52 year old Black Female in for routine health maintenance and evaluation of medical issues.  The patient has had considerable situational stress with her daughter causing her some financial stress.  Patient had Roux-en-Y gastric bypass surgery in 2012 by Dr. Wenda Low.  In 2014 Dr. Daphine Deutscher repaired laparoscopically an umbilical hernia  History of sleep apnea but apparently does not use CPAP  History of palpitations and anxiety.  Was seen at [facility] [place] [facility] heart and vascular in 2008.  She had 2D echocardiogram for palpitations which showed left atrium to be mildly dilated and borderline right atrial enlargement.  No ASD.  She had mild LVH.  Systolic function of the left ventricle was normal.  She had mild mitral regurgitation and mild tricuspid regurgitation.  Sleep study done at [facility] and sleep center showed mild to moderate obstructive sleep apnea.  At that time she weighed 342 pounds with a BMI 57.5.  She was treated with metoprolol.  History of anxiety depression for which she is taken Celexa in the past.  Family history: Mother with history of MI in her 73s.  Brother with diabetes mellitus.  Social history: She has 5 children.  She is divorced.  Has never smoked.  At one point says she had to file bankruptcy.  Financial stress worsened when her boyfriend moved out around 2008 and more recently with issues with her daughter.  No known drug allergies.  She had 2 foot surgeries in 2007.  Fractured left arm at age 86.  A consultation for possible mastopexy reduction in 2015 with Dr. Ulice Bold.  Bra size was 40 DD.  More recently has seen Dr. Cordelia Pen and has been cleared by cardiologist in [facility] for breast reduction surgery.  Has seen Dr. Raynald Kemp for podiatry issues.  In April 2017, patient had cotton osteotomy with bone graft right foot  Unilateral  osteoarthritis left knee seen by Dr. Magnus Ivan in 2018 treated with arthrocentesis and injection  Review of Systems     Objective:   Physical Exam  Constitutional: She is oriented to person, place, and time. She appears well-developed and well-nourished. No distress.  HENT:  Head: Normocephalic and atraumatic.  Right Ear: External ear normal.  Left Ear: External ear normal.  Mouth/Throat: Oropharynx is clear and moist.  Eyes: Pupils are equal, round, and reactive to light. EOM are normal. Right eye exhibits no discharge. Left eye exhibits no discharge.  Neck: Neck supple. No JVD present. No tracheal deviation present. No thyromegaly present.  Cardiovascular: Normal rate, regular rhythm and normal heart sounds. Exam reveals no gallop.  No murmur heard. Pulmonary/Chest: Effort normal and breath sounds normal. No stridor. No respiratory distress. She has no wheezes. She has no rales.  Abdominal: Soft. Bowel sounds are normal. She exhibits no distension and no mass. There is no tenderness.  Lymphadenopathy:    She has no cervical adenopathy.  Neurological: She is alert and oriented to person, place, and time. She displays normal reflexes. No cranial nerve deficit. She exhibits normal muscle tone. Coordination normal.  Skin: Skin is warm and dry. She is not diaphoretic.  Psychiatric: She has a normal mood and affect. Her behavior is normal. Judgment and thought content normal.          Assessment & Plan:  Patient is a candidate for breast reduction surgery  She is status post gastric bypass  surgery  Prior to losing over 100 pounds she had sleep apnea  Mild vitamin D deficiency-recommend 2000 units vitamin D3 daily.  Other lab work is within normal limits  Anxiety depression and situational stress with family  Plan: Return in 1 year or as needed.  Patient  apparently has not had screening colonoscopy.  Last mammogram on file here is 2016 at  at [facility]  For anxiety given  #30 Xanax tablets to take twice daily as needed with no refill

## 2017-07-06 NOTE — Patient Instructions (Addendum)
It was a pleasure to see you today.  Take 2000 units vitamin D3 daily.  Take Xanax very sparingly for situational stress.  Return in 1 year or as needed.  Need to have screening colonoscopy and mammogram

## 2017-07-07 ENCOUNTER — Encounter (INDEPENDENT_AMBULATORY_CARE_PROVIDER_SITE_OTHER): Payer: Self-pay | Admitting: Orthopaedic Surgery

## 2017-07-07 ENCOUNTER — Ambulatory Visit (INDEPENDENT_AMBULATORY_CARE_PROVIDER_SITE_OTHER): Payer: 59 | Admitting: Orthopaedic Surgery

## 2017-07-07 ENCOUNTER — Ambulatory Visit (INDEPENDENT_AMBULATORY_CARE_PROVIDER_SITE_OTHER): Payer: Self-pay

## 2017-07-07 DIAGNOSIS — M25561 Pain in right knee: Secondary | ICD-10-CM | POA: Diagnosis not present

## 2017-07-07 DIAGNOSIS — M1612 Unilateral primary osteoarthritis, left hip: Secondary | ICD-10-CM | POA: Diagnosis not present

## 2017-07-07 DIAGNOSIS — G8929 Other chronic pain: Secondary | ICD-10-CM

## 2017-07-07 DIAGNOSIS — M25562 Pain in left knee: Secondary | ICD-10-CM

## 2017-07-07 NOTE — Progress Notes (Signed)
Office Visit Note   Patient: Emma Gonzales           Date of Birth: 1965/07/25           MRN: [id number] Visit Date: 07/07/2017              Requested by: [name], MD [address] [place], [place] [address] PCP: [name], MD   Assessment & Plan: Visit Diagnoses:  1. Chronic pain of both knees   2. Unilateral primary osteoarthritis, left hip     Plan: At this point the only treatment option that I can offer for her left knee is a knee replacement given the significance of her valgus malalignment and the significance of the severe arthritis of her left knee.  Certainly at age 32 this is a tough undertaking for her to consider given that she is young.  Certainly her obesity will play into it as well but I do feel comfortable with performing the surgery if she decides to have this done.  I showed her a knee model and went over in detail what the surgery involves including a long and thorough discussion of the risk minutes of surgery as well as what her intraoperative and postoperative course will be.  She will think about this.  I gave her surgery schedulers card and have any doctor anytime further if she like to have this scheduled.  Follow-Up Instructions: Return if symptoms worsen or fail to improve.   Orders:  Orders Placed This Encounter  Procedures  . XR Knee 1-2 Views Left  . XR Knee 1-2 Views Right   No orders of the defined types were placed in this encounter.     Procedures: No procedures performed   Clinical Data: No additional findings.   Subjective: Chief Complaint  Patient presents with  . Left Knee - Follow-up  . Right Knee - Follow-up  The patient is someone I seen in the past.  He comes in today with bilateral knee pain with the left much worse than the right.  She has had steroid injection in the left knee and consider hyaluronic acid however the steroid injection barely work for her.  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  She is moderately obese with a BMI of 44.8.  Is really her left knee that bothers her more significantly than her right knee and the right knee might be from her trying to offload her left knee due to the pain in her left knee.  She is never injured her knees before.  The pain is waking her up at night.  At times it can be 10 out of 10.  Is beginning detrimental effect directives daily living, quality of life, mobility.  This is been getting worse for now well over a year.  Left knee does swell on her as well.  HPI  Review of Systems She currently denies any headache, chest pain, short of breath, fever, chills, nausea, vomiting.  Objective: Vital Signs: LMP 06/13/2017 Comment: patient is currently spotting   Physical Exam She is alert and oriented x3 and in no acute distress Ortho Exam Examination of both knees show significant valgus malalignment of the left knee which is only mild on the right knee.  She has exquisite lateral joint line tenderness with significant patellofemoral crepitation on the left side.  Her right knee exam she has minimal pain discomfort. Specialty Comments:  No specialty comments available.  Imaging: Xr Knee 1-2 Views Left  Result Date: 07/07/2017 2 views of left knee show significant arthritic changes with valgus malalignment.  There is complete loss of lateral joint space and periarticular osteophytes in all 3 compartments.  Xr Knee 1-2 Views Right  Result Date: 07/07/2017 2 views of the right knee show no acute findings and only mild arthritic findings mainly at the patellofemoral joint.  There is only slight joint space narrowing.    PMFS History: Patient Active Problem List   Diagnosis Date Noted  . Unilateral primary osteoarthritis, left hip 07/07/2017  . Chronic pain of both knees 07/07/2017  . Chronic pain of left knee 12/25/2016  . Unilateral primary osteoarthritis, left knee 12/25/2016  . Porokeratosis 07/07/2013   . Pain in lower limb 07/07/2013  . Eschar of foot 07/07/2013  . S/P umbilical hernia repair, follow-up exam 01/08/2013  . PAC (premature atrial contraction) 02/05/2012  . Lap gastric bypass Nov 2012 06/19/2011  . Palpitations 06/14/2011  . History of sleep apnea 06/14/2011  . Obesity 10/17/2010   Past Medical History:  Diagnosis Date  . Anxiety attack 01-24-11   previoulsy and only once  . GERD (gastroesophageal reflux disease) 01-24-11   reflux with hiatal hernia-no regular meds  . Hiatal hernia 01-24-11   hx. of this recent dx./no nerve issues  . Leg pain   . Obesity   . PONV (postoperative nausea and vomiting) 01-24-11   pt. has PONV with anesthesia occ.  . Sleep apnea 01-24-11   no cpap used  . Varicose vein of leg    BILATERAL    Family History  Problem Relation Age of Onset  . Diabetes Mother   . Heart disease Mother        Not sure what type of heart disease  . Hyperlipidemia Father   . Heart attack Father   . Diabetes Brother     Past Surgical History:  Procedure Laterality Date  . CHOLECYSTECTOMY  01-24-11   '96-Laparoscopic  . Cotton Osteotomy w/ Bone Graft Right 06/16/2015  . ENDOMETRIAL ABLATION W/ NOVASURE    . FOOT SURGERY  01-24-11   bilateral  . GASTRIC ROUX-EN-Y  01/28/2011   Procedure: LAPAROSCOPIC ROUX-EN-Y GASTRIC;  Surgeon: Valarie Merino, MD;  Location: [facility];  Service: General;  Laterality: N/A;  With upper endoscopy  . ORIF METATARSAL FRACTURE Right 07/14/2015  . TUBAL LIGATION    . UMBILICAL HERNIA REPAIR N/A 12/23/2012   Procedure: LAPAROSCOPY  REPAIR  UMBILICAL HERNIA, CLOSURE OF INTERNAL HERNIA DEFECT;  Surgeon: Valarie Merino, MD;  Location: [facility];  Service: General;  Laterality: N/A;   Social History   Occupational History  . Occupation: ORDER Stage manager: [employer]  Tobacco Use  . Smoking status: Never Smoker  . Smokeless tobacco: Never Used  Substance and Sexual Activity  . Alcohol use: Yes     Alcohol/week: 0.6 oz    Types: 1 Cans of beer per week    Comment: one beer every other day   . Drug use: No  . Sexual activity: Yes

## 2017-10-14 ENCOUNTER — Encounter (INDEPENDENT_AMBULATORY_CARE_PROVIDER_SITE_OTHER): Payer: Self-pay | Admitting: Physician Assistant

## 2017-10-14 ENCOUNTER — Ambulatory Visit (INDEPENDENT_AMBULATORY_CARE_PROVIDER_SITE_OTHER): Payer: Self-pay

## 2017-10-14 ENCOUNTER — Ambulatory Visit (INDEPENDENT_AMBULATORY_CARE_PROVIDER_SITE_OTHER): Payer: 59 | Admitting: Physician Assistant

## 2017-10-14 DIAGNOSIS — M25561 Pain in right knee: Secondary | ICD-10-CM | POA: Diagnosis not present

## 2017-10-14 DIAGNOSIS — M25511 Pain in right shoulder: Secondary | ICD-10-CM | POA: Diagnosis not present

## 2017-10-14 MED ORDER — METHYLPREDNISOLONE ACETATE 40 MG/ML IJ SUSP
40.0000 mg | INTRAMUSCULAR | Status: AC | PRN
  Administered 2017-10-14: 40 mg via INTRA_ARTICULAR

## 2017-10-14 MED ORDER — LIDOCAINE HCL 1 % IJ SOLN
3.0000 mL | INTRAMUSCULAR | Status: AC | PRN
  Administered 2017-10-14: 3 mL

## 2017-10-14 NOTE — Progress Notes (Signed)
Office Visit Note   Patient: Emma Gonzales           Date of Birth: April 09, 1965           MRN: [id number] Visit Date: 10/14/2017              Requested by: Emma MackintoshBaxley, Mary J, MD [address] [place] [id number] PCP: Emma MackintoshBaxley, Mary J, MD   Assessment & Plan: Visit Diagnoses:  1. Acute pain of right shoulder   2. Right knee pain, unspecified chronicity     Plan: She is shown quad strengthening exercises for her knee.  Also shown pendulum, wall crawls and forward flexion exercises for the right shoulder.  See her back in 2 weeks check her progress or lack of.  Questions encouraged and answered at length.  Follow-Up Instructions: Return in about 2 weeks (around 10/28/2017).   Orders:  Orders Placed This Encounter  Procedures  . Large Joint Inj  . Large Joint Inj  . XR Knee 1-2 Views Right  . XR Shoulder Right   No orders of the defined types were placed in this encounter.     Procedures: Large Joint Inj: R knee on 10/14/2017 12:31 PM Indications: pain Details: 22 G 1.5 in needle, anterolateral approach  Arthrogram: No  Medications: 3 mL lidocaine 1 %; 40 mg methylPREDNISolone acetate 40 MG/ML Outcome: tolerated well, no immediate complications Procedure, treatment alternatives, risks and benefits explained, specific risks discussed. Consent was given by the patient. Immediately prior to procedure a time out was called to verify the correct patient, procedure, equipment, support staff and site/side marked as required. Patient was prepped and draped in the usual sterile fashion.   Large Joint Inj: R subacromial bursa on 10/14/2017 12:32 PM Indications: pain Details: 22 G 1.5 in needle, lateral approach  Arthrogram: No  Medications: 3 mL lidocaine 1 %; 40 mg methylPREDNISolone acetate 40 MG/ML Outcome: tolerated well, no immediate complications Procedure, treatment alternatives, risks and benefits explained, specific risks discussed. Consent was given by the  patient. Immediately prior to procedure a time out was called to verify the correct patient, procedure, equipment, support staff and site/side marked as required. Patient was prepped and draped in the usual sterile fashion.       Clinical Data: No additional findings.   Subjective: Chief Complaint  Patient presents with  . Right Shoulder - Pain  . Right Knee - Pain    HPI Emma Gonzales is a 52 year old female well-known to Dr. Magnus IvanBlackman service.  She comes in today due to right knee pain and right shoulder pain that started about 3 weeks ago.  She has known significant arthritis left knee with valgus malalignment.  Left knee however is not bothering her today.  States she has a lot of popping in the right knee but it is not particularly painful.  No giving way catching or locking of the knee.  She had no injury to the knee.  She does take occasional ibuprofen which helps with her knee pain.  Right shoulder pain no known injury.  No radicular symptoms down either arm.  She notes no cervical pain.  Pain in the shoulder does awaken her.  She does have some tenderness about the shoulder girdle.  She states that she does a lot of lifting at work.  She is nondiabetic. Review of Systems Please see HPI otherwise negative  Objective: Vital Signs: There were no vitals taken for this visit.  Physical Exam  Constitutional: She is oriented  to person, place, and time. She appears well-developed and well-nourished. No distress.  Pulmonary/Chest: Effort normal.  Neurological: She is alert and oriented to person, place, and time.  Skin: She is not diaphoretic.  Psychiatric: She has a normal mood and affect.    Ortho Exam Bilateral shoulders she has 5 out of 5 strength with external and internal rotation against resistance.  Negative empty can test bilaterally.  Positive impingement on the right negative on the left.  She has positive abduction on the right negative on the left.  Full motor full  sensation bilateral hands.  Radial pulses are intact. Right knee valgus deformity of the right knee.  She has tenderness over the medial lateral joint lines.  Positive patellofemoral crepitus.  No instability valgus varus stressing.  No effusion abnormal warmth of either knee. Specialty Comments:  No specialty comments available.  Imaging: Xr Knee 1-2 Views Right  Result Date: 10/14/2017 Right knee 2 views: No acute fracture.  Mild to moderate patellofemoral changes.  Very mild medial lateral joint line narrowing.  Knee is well located.   Xr Shoulder Right  Result Date: 10/14/2017 Right shoulder 3 views: No acute fracture.  Moderate AC joint changes.  Otherwise shoulders well located.  Glenohumeral joint is well-maintained maintained.  On the Y view the subacromial space well maintained.    PMFS History: Patient Active Problem List   Diagnosis Date Noted  . Unilateral primary osteoarthritis, left hip 07/07/2017  . Chronic pain of both knees 07/07/2017  . Chronic pain of left knee 12/25/2016  . Unilateral primary osteoarthritis, left knee 12/25/2016  . Porokeratosis 07/07/2013  . Pain in lower limb 07/07/2013  . Eschar of foot 07/07/2013  . S/P umbilical hernia repair, follow-up exam 01/08/2013  . PAC (premature atrial contraction) 02/05/2012  . Lap gastric bypass Nov 2012 06/19/2011  . Palpitations 06/14/2011  . History of sleep apnea 06/14/2011  . Obesity 10/17/2010   Past Medical History:  Diagnosis Date  . Anxiety attack 01-24-11   previoulsy and only once  . GERD (gastroesophageal reflux disease) 01-24-11   reflux with hiatal hernia-no regular meds  . Hiatal hernia 01-24-11   hx. of this recent dx./no nerve issues  . Leg pain   . Obesity   . PONV (postoperative nausea and vomiting) 01-24-11   pt. has PONV with anesthesia occ.  . Sleep apnea 01-24-11   no cpap used  . Varicose vein of leg    BILATERAL    Family History  Problem Relation Age of Onset  . Diabetes  Mother   . Heart disease Mother        Not sure what type of heart disease  . Hyperlipidemia Father   . Heart attack Father   . Diabetes Brother     Past Surgical History:  Procedure Laterality Date  . CHOLECYSTECTOMY  01-24-11   '96-Laparoscopic  . Cotton Osteotomy w/ Bone Graft Right 06/16/2015  . ENDOMETRIAL ABLATION W/ NOVASURE    . FOOT SURGERY  01-24-11   bilateral  . GASTRIC ROUX-EN-Y  01/28/2011   Procedure: LAPAROSCOPIC ROUX-EN-Y GASTRIC;  Surgeon: Valarie Merino, MD;  Location: [facility];  Service: General;  Laterality: N/A;  With upper endoscopy  . ORIF METATARSAL FRACTURE Right 07/14/2015  . TUBAL LIGATION    . UMBILICAL HERNIA REPAIR N/A 12/23/2012   Procedure: LAPAROSCOPY  REPAIR  UMBILICAL HERNIA, CLOSURE OF INTERNAL HERNIA DEFECT;  Surgeon: Valarie Merino, MD;  Location: [facility];  Service: General;  Laterality: N/A;  Social History   Occupational History  . Occupation: ORDER Stage manager: [employer]  Tobacco Use  . Smoking status: Never Smoker  . Smokeless tobacco: Never Used  Substance and Sexual Activity  . Alcohol use: Yes    Alcohol/week: 0.6 oz    Types: 1 Cans of beer per week    Comment: one beer every other day   . Drug use: No  . Sexual activity: Yes

## 2017-10-24 ENCOUNTER — Other Ambulatory Visit (INDEPENDENT_AMBULATORY_CARE_PROVIDER_SITE_OTHER): Payer: Self-pay | Admitting: Orthopaedic Surgery

## 2017-11-10 ENCOUNTER — Other Ambulatory Visit (INDEPENDENT_AMBULATORY_CARE_PROVIDER_SITE_OTHER): Payer: Self-pay | Admitting: Orthopaedic Surgery

## 2017-11-11 ENCOUNTER — Ambulatory Visit (INDEPENDENT_AMBULATORY_CARE_PROVIDER_SITE_OTHER): Payer: 59 | Admitting: Orthopaedic Surgery

## 2017-11-12 ENCOUNTER — Other Ambulatory Visit: Payer: Self-pay

## 2017-11-12 ENCOUNTER — Encounter (HOSPITAL_COMMUNITY): Payer: Self-pay

## 2017-11-12 ENCOUNTER — Emergency Department (HOSPITAL_COMMUNITY)
Admission: EM | Admit: 2017-11-12 | Discharge: 2017-11-12 | Disposition: A | Discharge status: Home / Self Care | Payer: 59 | Attending: Emergency Medicine | Admitting: Emergency Medicine

## 2017-11-12 DIAGNOSIS — Z79899 Other long term (current) drug therapy: Secondary | ICD-10-CM | POA: Diagnosis not present

## 2017-11-12 DIAGNOSIS — J029 Acute pharyngitis, unspecified: Secondary | ICD-10-CM | POA: Insufficient documentation

## 2017-11-12 LAB — GROUP A STREP BY PCR: Group A Strep by PCR: DETECTED — AB

## 2017-11-12 MED ORDER — DEXAMETHASONE 4 MG PO TABS
10.0000 mg | ORAL_TABLET | Freq: Once | ORAL | Status: AC
  Administered 2017-11-12: 10 mg via ORAL
  Filled 2017-11-12: qty 2

## 2017-11-12 MED ORDER — PENICILLIN G BENZATHINE 1200000 UNIT/2ML IM SUSP
1.2000 10*6.[IU] | Freq: Once | INTRAMUSCULAR | Status: AC
  Administered 2017-11-12: 1.2 10*6.[IU] via INTRAMUSCULAR
  Filled 2017-11-12: qty 2

## 2017-11-12 MED ORDER — FLUCONAZOLE 200 MG PO TABS: 200.0000 mg | ORAL_TABLET | Freq: Once | ORAL | 0 refills | Status: DC | PRN

## 2017-11-12 MED ORDER — KETOROLAC TROMETHAMINE 60 MG/2ML IM SOLN
60.0000 mg | Freq: Once | INTRAMUSCULAR | Status: AC
  Administered 2017-11-12: 60 mg via INTRAMUSCULAR
  Filled 2017-11-12: qty 2

## 2017-11-12 MED ORDER — LIDOCAINE VISCOUS HCL 2 % MT SOLN
15.0000 mL | Freq: Once | OROMUCOSAL | Status: AC
  Administered 2017-11-12: 15 mL via OROMUCOSAL
  Filled 2017-11-12: qty 15

## 2017-11-12 MED ORDER — LIDOCAINE VISCOUS HCL 2 % MT SOLN: 15.0000 mL | OROMUCOSAL | 0 refills | Status: DC | PRN

## 2017-11-12 NOTE — ED Provider Notes (Signed)
Emergency Department Provider Note   I have reviewed the triage vital signs and the nursing notes.   HISTORY  Chief Complaint Sore Throat   HPI Emma Gonzales is a 52 y.o. female with past medical history as documented below the presents to the emergency department today with sore throat.  Patient states is been there for about 2 or 3 days and progressively worsening.  She states that she does not have any voice changes but she does have a pain with swallowing.  She has no neck pain, headaches but does have generalized body aches.  No pain with range of motion of her neck.  States she had strep throat in the past this feels exactly the same.  She had fevers at home.  No cough. No other associated or modifying symptoms.    Past Medical History:  Diagnosis Date  . Anxiety attack 01-24-11   previoulsy and only once  . GERD (gastroesophageal reflux disease) 01-24-11   reflux with hiatal hernia-no regular meds  . Hiatal hernia 01-24-11   hx. of this recent dx./no nerve issues  . Leg pain   . Obesity   . PONV (postoperative nausea and vomiting) 01-24-11   pt. has PONV with anesthesia occ.  . Sleep apnea 01-24-11   no cpap used  . Varicose vein of leg    BILATERAL    Patient Active Problem List   Diagnosis Date Noted  . Unilateral primary osteoarthritis, left hip 07/07/2017  . Chronic pain of both knees 07/07/2017  . Chronic pain of left knee 12/25/2016  . Unilateral primary osteoarthritis, left knee 12/25/2016  . Porokeratosis 07/07/2013  . Pain in lower limb 07/07/2013  . Eschar of foot 07/07/2013  . S/P umbilical hernia repair, follow-up exam 01/08/2013  . PAC (premature atrial contraction) 02/05/2012  . Lap gastric bypass Nov 2012 06/19/2011  . Palpitations 06/14/2011  . History of sleep apnea 06/14/2011  . Obesity 10/17/2010    Past Surgical History:  Procedure Laterality Date  . CHOLECYSTECTOMY  01-24-11   '96-Laparoscopic  . Cotton Osteotomy w/ Bone Graft  Right 06/16/2015  . ENDOMETRIAL ABLATION W/ NOVASURE    . FOOT SURGERY  01-24-11   bilateral  . GASTRIC ROUX-EN-Y  01/28/2011   Procedure: LAPAROSCOPIC ROUX-EN-Y GASTRIC;  Surgeon: Valarie Merino, MD;  Location: [facility];  Service: General;  Laterality: N/A;  With upper endoscopy  . ORIF METATARSAL FRACTURE Right 07/14/2015  . TUBAL LIGATION    . UMBILICAL HERNIA REPAIR N/A 12/23/2012   Procedure: LAPAROSCOPY  REPAIR  UMBILICAL HERNIA, CLOSURE OF INTERNAL HERNIA DEFECT;  Surgeon: Valarie Merino, MD;  Location: [facility];  Service: General;  Laterality: N/A;    Current Outpatient Rx  . Order #: [id number] Class: Historical Med  . Order #: [id number] Class: Historical Med  . Order #: [id number] Class: Historical Med  . Order #: [id number] Class: Normal  . Order #: [id number] Class: Print  . Order #: [id number] Class: Normal  . Order #: [id number] Class: Print    Allergies Amoxicillin  Family History  Problem Relation Age of Onset  . Diabetes Mother   . Heart disease Mother        Not sure what type of heart disease  . Hyperlipidemia Father   . Heart attack Father   . Diabetes Brother     Social History Social History   Tobacco Use  . Smoking status: Never Smoker  . Smokeless tobacco: Never Used  Substance Use Topics  . Alcohol use: Yes  Alcohol/week: 1.0 standard drinks    Types: 1 Cans of beer per week    Comment: one beer every other day   . Drug use: No    Review of Systems  All other systems negative except as documented in the HPI. All pertinent positives and negatives as reviewed in the HPI. ____________________________________________   PHYSICAL EXAM:  VITAL SIGNS: ED Triage Vitals  Enc Vitals Group     BP 11/12/17 0810 138/61     Pulse Rate 11/12/17 0810 80     Resp 11/12/17 0810 15     Temp 11/12/17 0810 99.3 F (37.4 C)     Temp Source 11/12/17 0810 Oral     SpO2 11/12/17 0810 98 %     Weight 11/12/17 0811 258 lb (117 kg)     Height 11/12/17 0811 5\' 4"   (1.626 m)    Constitutional: Alert and oriented. Well appearing and in no acute distress. Eyes: Conjunctivae are normal. PERRL. EOMI. Head: Atraumatic. Nose: No congestion/rhinnorhea. Mouth/Throat: Mucous membranes are moist.  Oropharynx erythematous with symmetric enlarged tonsils. Neck: No stridor.  No meningeal signs. Cervical adenopathy present. No pain with ROM.  Cardiovascular: Normal rate, regular rhythm. Good peripheral circulation. Grossly normal heart sounds.   Respiratory: Normal respiratory effort.  No retractions. Lungs CTAB. Gastrointestinal: Soft and nontender. No distention.  Musculoskeletal: No lower extremity tenderness nor edema. No gross deformities of extremities. Neurologic:  Normal speech and language. No gross focal neurologic deficits are appreciated.  Skin:  Skin is warm, dry and intact. No rash noted.  ____________________________________________   LABS (all labs ordered are listed, but only abnormal results are displayed)  Labs Reviewed  GROUP A STREP BY PCR   ____________________________________________   INITIAL IMPRESSION / ASSESSMENT AND PLAN / ED COURSE  Likely strep throat without evidence of complications such as abscess or meningitis.  tx accordingly.      Pertinent labs & imaging results that were available during my care of the patient were reviewed by me and considered in my medical decision making (see chart for details).  ____________________________________________  FINAL CLINICAL IMPRESSION(S) / ED DIAGNOSES  Final diagnoses:  Pharyngitis, unspecified etiology     MEDICATIONS GIVEN DURING THIS VISIT:  Medications  penicillin g benzathine (BICILLIN LA) 1200000 UNIT/2ML injection 1.2 Million Units (has no administration in time range)  dexamethasone (DECADRON) tablet 10 mg (has no administration in time range)  ketorolac (TORADOL) injection 60 mg (has no administration in time range)  lidocaine (XYLOCAINE) 2 % viscous mouth  solution 15 mL (has no administration in time range)     NEW OUTPATIENT MEDICATIONS STARTED DURING THIS VISIT:  New Prescriptions   FLUCONAZOLE (DIFLUCAN) 200 MG TABLET    Take 1 tablet (200 mg total) by mouth once as needed for up to 1 dose (yeast infection).   LIDOCAINE (XYLOCAINE) 2 % SOLUTION    Use as directed 15 mLs in the mouth or throat every 4 (four) hours as needed for mouth pain (or throat pain).    Note:  This note was prepared with assistance of Dragon voice recognition software. Occasional wrong-word or sound-a-like substitutions may have occurred due to the inherent limitations of voice recognition software.   Chala Gul, Barbara Cower, MD 11/12/17 [phone]

## 2017-11-12 NOTE — ED Triage Notes (Signed)
Pt states sore throat since Monday, which has gotten progressively worse.

## 2017-12-15 ENCOUNTER — Other Ambulatory Visit (INDEPENDENT_AMBULATORY_CARE_PROVIDER_SITE_OTHER): Payer: Self-pay | Admitting: Orthopaedic Surgery

## 2018-03-23 ENCOUNTER — Encounter (INDEPENDENT_AMBULATORY_CARE_PROVIDER_SITE_OTHER): Payer: Self-pay | Admitting: Orthopaedic Surgery

## 2018-03-23 ENCOUNTER — Ambulatory Visit (INDEPENDENT_AMBULATORY_CARE_PROVIDER_SITE_OTHER): Payer: 59 | Admitting: Orthopaedic Surgery

## 2018-03-23 DIAGNOSIS — M25512 Pain in left shoulder: Secondary | ICD-10-CM

## 2018-03-23 DIAGNOSIS — M7542 Impingement syndrome of left shoulder: Secondary | ICD-10-CM | POA: Diagnosis not present

## 2018-03-23 MED ORDER — NAPROXEN 500 MG PO TABS: 500.0000 mg | ORAL_TABLET | Freq: Two times a day (BID) | ORAL | 3 refills | Status: DC | PRN

## 2018-03-23 MED ORDER — LIDOCAINE HCL 1 % IJ SOLN
3.0000 mL | INTRAMUSCULAR | Status: AC | PRN
  Administered 2018-03-23: 3 mL

## 2018-03-23 MED ORDER — METHYLPREDNISOLONE ACETATE 40 MG/ML IJ SUSP
40.0000 mg | INTRAMUSCULAR | Status: AC | PRN
  Administered 2018-03-23: 40 mg via INTRA_ARTICULAR

## 2018-03-23 NOTE — Progress Notes (Signed)
Office Visit Note   Patient: Emma Gonzales           Date of Birth: 12/06/1965           MRN: [id number] Visit Date: 03/23/2018              Requested by: [name], MD [address] [place], [place] [address] PCP: [name], MD   Assessment & Plan: Visit Diagnoses:  1. Impingement syndrome of left shoulder   2. Acute pain of left shoulder     Plan: I do feel that she is dealing with shoulder impingement syndrome as is she.  I am pleased with her range of motion of her shoulder so I do not feel that she needs physical therapy.  I do feel that she would benefit from a subacromial steroid injection since that did well on the right side.  She agrees as well.  She understands fully the risk and benefits of these types of injections and I provided this well without difficulty.  All questions concerns were answered and addressed.  I did send in some naproxen for her as well.  Follow-up can be as needed.  Follow-Up Instructions: Return if symptoms worsen or fail to improve.   Orders:  Orders Placed This Encounter  Procedures  . Large Joint Inj   Meds ordered this encounter  Medications  . naproxen (NAPROSYN) 500 MG tablet    Sig: Take 1 tablet (500 mg total) by mouth 2 (two) times daily between meals as needed.    Dispense:  60 tablet    Refill:  3      Procedures: Large Joint Inj: L subacromial bursa on 03/23/2018 11:05 AM Indications: pain and diagnostic evaluation Details: 22 G 1.5 in needle  Arthrogram: No  Medications: 3 mL lidocaine 1 %; 40 mg methylPREDNISolone acetate 40 MG/ML Outcome: tolerated well, no immediate complications Procedure, treatment alternatives, risks and benefits explained, specific risks discussed. Consent was given by the patient. Immediately prior to procedure a time out was called to verify the correct patient, procedure, equipment, support staff and site/side marked as required. Patient was prepped and draped in the usual sterile  fashion.       Clinical Data: No additional findings.   Subjective: Chief Complaint  Patient presents with  . Left Shoulder - Pain  The patient comes in today for evaluation treatment of left shoulder pain.  We actually have not x-rayed that shoulder before we x-rayed the shoulder on the right side in August and provided injection in that shoulder.  That is done well for her.  She said the left shoulder feels about the same as what she did with the right shoulder.  She has had decreased range of motion and strength in the shoulder with no injury that she is aware of.  She is wondering if that injection will help the left side like it did the right side.  She is not a diabetic.  She denies any neck pain denies any numbness and tingling in her right hand or left hand.  HPI  Review of Systems She currently denies any headache, chest pain, shortness of breath, fever, chills, nausea, vomiting.  Objective: Vital Signs: There were no vitals taken for this visit.  Physical Exam She is alert and orient x3 and in no acute distress Ortho Exam Examination of her left shoulder does show positive Neer and Hawkins signs but her range of motion is full.  Rotator cuff feels strong.  She has pain along the subacromial outlet of her left shoulder in the [facility] joint.  She has 5 out of 5 strength in the rotator cuff itself. Specialty Comments:  No specialty comments available.  Imaging: No results found.   PMFS History: Patient Active Problem List   Diagnosis Date Noted  . Unilateral primary osteoarthritis, left hip 07/07/2017  . Chronic pain of both knees 07/07/2017  . Chronic pain of left knee 12/25/2016  . Unilateral primary osteoarthritis, left knee 12/25/2016  . Porokeratosis 07/07/2013  . Pain in lower limb 07/07/2013  . Eschar of foot 07/07/2013  . S/P umbilical hernia repair, follow-up exam 01/08/2013  . PAC (premature atrial contraction) 02/05/2012  . Lap gastric bypass Nov 2012  06/19/2011  . Palpitations 06/14/2011  . History of sleep apnea 06/14/2011  . Obesity 10/17/2010   Past Medical History:  Diagnosis Date  . Anxiety attack 01-24-11   previoulsy and only once  . GERD (gastroesophageal reflux disease) 01-24-11   reflux with hiatal hernia-no regular meds  . Hiatal hernia 01-24-11   hx. of this recent dx./no nerve issues  . Leg pain   . Obesity   . PONV (postoperative nausea and vomiting) 01-24-11   pt. has PONV with anesthesia occ.  . Sleep apnea 01-24-11   no cpap used  . Varicose vein of leg    BILATERAL    Family History  Problem Relation Age of Onset  . Diabetes Mother   . Heart disease Mother        Not sure what type of heart disease  . Hyperlipidemia Father   . Heart attack Father   . Diabetes Brother     Past Surgical History:  Procedure Laterality Date  . CHOLECYSTECTOMY  01-24-11   '96-Laparoscopic  . Cotton Osteotomy w/ Bone Graft Right 06/16/2015  . ENDOMETRIAL ABLATION W/ NOVASURE    . FOOT SURGERY  01-24-11   bilateral  . GASTRIC ROUX-EN-Y  01/28/2011   Procedure: LAPAROSCOPIC ROUX-EN-Y GASTRIC;  Surgeon: Valarie Merino, MD;  Location: [facility];  Service: General;  Laterality: N/A;  With upper endoscopy  . ORIF METATARSAL FRACTURE Right 07/14/2015  . TUBAL LIGATION    . UMBILICAL HERNIA REPAIR N/A 12/23/2012   Procedure: LAPAROSCOPY  REPAIR  UMBILICAL HERNIA, CLOSURE OF INTERNAL HERNIA DEFECT;  Surgeon: Valarie Merino, MD;  Location: [facility];  Service: General;  Laterality: N/A;   Social History   Occupational History  . Occupation: ORDER Stage manager: [employer]  Tobacco Use  . Smoking status: Never Smoker  . Smokeless tobacco: Never Used  Substance and Sexual Activity  . Alcohol use: Yes    Alcohol/week: 1.0 standard drinks    Types: 1 Cans of beer per week    Comment: one beer every other day   . Drug use: No  . Sexual activity: Yes

## 2018-05-01 DIAGNOSIS — Z01419 Encounter for gynecological examination (general) (routine) without abnormal findings: Secondary | ICD-10-CM | POA: Diagnosis not present

## 2018-05-01 DIAGNOSIS — Z1231 Encounter for screening mammogram for malignant neoplasm of breast: Secondary | ICD-10-CM | POA: Diagnosis not present

## 2018-05-01 DIAGNOSIS — Z6841 Body Mass Index (BMI) 40.0 and over, adult: Secondary | ICD-10-CM | POA: Diagnosis not present

## 2018-05-14 ENCOUNTER — Encounter: Payer: Self-pay | Admitting: Internal Medicine

## 2018-05-14 ENCOUNTER — Ambulatory Visit (INDEPENDENT_AMBULATORY_CARE_PROVIDER_SITE_OTHER): Payer: 59 | Admitting: Internal Medicine

## 2018-05-14 VITALS — BP 110/70 | HR 80 | Temp 98.3°F | Ht 64.0 in | Wt 276.0 lb

## 2018-05-14 DIAGNOSIS — J22 Unspecified acute lower respiratory infection: Secondary | ICD-10-CM | POA: Diagnosis not present

## 2018-05-14 MED ORDER — AZITHROMYCIN 250 MG PO TABS: ORAL_TABLET | ORAL | 0 refills | Status: DC

## 2018-05-14 MED ORDER — HYDROCODONE-HOMATROPINE 5-1.5 MG/5ML PO SYRP: 5.0000 mL | ORAL_SOLUTION | Freq: Three times a day (TID) | ORAL | 0 refills | Status: DC | PRN

## 2018-05-14 NOTE — Progress Notes (Signed)
   Subjective:    Patient ID: Emma Gonzales, female    DOB: 1965-06-25, 53 y.o.   MRN: [id number]  HPI 2 weeks history of cough and congestion. 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 No fever or chills. No myalgias.C  Plan: Omplaining of discolored sputum.    Review of Systems nasal congestion     Objective:   Physical Exam Skin warm and dry.  Nodes none.  TMs clear.  Pharynx slightly injected.  Neck is supple.  Chest clear to auscultation.       Assessment & Plan:  Acute lower respiratory infection  Plan: Zithromax Z-PAK take 2 tablets day 1 followed by 1 tablet days 2 through 5.  Hycodan 1 teaspoon p.o. every 8 hours as needed cough

## 2018-06-06 ENCOUNTER — Encounter: Payer: Self-pay | Admitting: Internal Medicine

## 2018-06-06 NOTE — Patient Instructions (Signed)
Zithromax Z-PAK take 2 tablets day 1 followed by 1 tablet days 2 through 5.  Hycodan 1 teaspoon p.o. every 8 hours as needed cough.  Rest and drink plenty of fluids. 

## 2018-06-09 ENCOUNTER — Telehealth: Payer: Self-pay | Admitting: Internal Medicine

## 2018-06-09 DIAGNOSIS — J22 Unspecified acute lower respiratory infection: Secondary | ICD-10-CM

## 2018-06-09 MED ORDER — DOXYCYCLINE HYCLATE 100 MG PO TABS: 100.0000 mg | ORAL_TABLET | Freq: Two times a day (BID) | ORAL | 0 refills | Status: DC

## 2018-06-09 NOTE — Telephone Encounter (Signed)
Emma Gonzales [phone]  Emma Gonzales called to say she still has a cough and some mucus. She is wandering what to do? She finished all the medicine you gave her back on 05/14/18. Still seems to be lingering.

## 2018-06-09 NOTE — Telephone Encounter (Signed)
rx was sent in

## 2018-06-09 NOTE — Telephone Encounter (Signed)
Patient was notified, patient verbalized understanding.

## 2018-06-09 NOTE — Telephone Encounter (Signed)
Please change to Doxycycline 100 mg twice a day x 10 days. Let her know we are changing meds.

## 2018-07-23 ENCOUNTER — Other Ambulatory Visit (INDEPENDENT_AMBULATORY_CARE_PROVIDER_SITE_OTHER): Payer: Self-pay | Admitting: Orthopaedic Surgery

## 2018-08-14 ENCOUNTER — Ambulatory Visit: Payer: 59 | Admitting: Internal Medicine

## 2019-01-06 ENCOUNTER — Telehealth: Payer: Self-pay | Admitting: Internal Medicine

## 2019-01-06 ENCOUNTER — Encounter: Payer: Self-pay | Admitting: Internal Medicine

## 2019-01-06 NOTE — Telephone Encounter (Signed)
LVM to CB and schedule CPE and Labs past due 06/28/18

## 2019-01-06 NOTE — Telephone Encounter (Signed)
Mailed letter to Call Office and schedule past due CPE (06/13/17)

## 2019-01-08 NOTE — Telephone Encounter (Signed)
Mende called back and schedule CPE and labs

## 2019-02-10 ENCOUNTER — Telehealth: Payer: Self-pay

## 2019-02-10 ENCOUNTER — Ambulatory Visit (INDEPENDENT_AMBULATORY_CARE_PROVIDER_SITE_OTHER): Payer: 59 | Admitting: Internal Medicine

## 2019-02-10 ENCOUNTER — Encounter: Payer: Self-pay | Admitting: Internal Medicine

## 2019-02-10 ENCOUNTER — Other Ambulatory Visit: Payer: Self-pay

## 2019-02-10 VITALS — Ht 64.0 in | Wt 276.0 lb

## 2019-02-10 DIAGNOSIS — J019 Acute sinusitis, unspecified: Secondary | ICD-10-CM

## 2019-02-10 MED ORDER — AZITHROMYCIN 250 MG PO TABS: ORAL_TABLET | ORAL | 0 refills | Status: DC

## 2019-02-10 MED ORDER — FLUCONAZOLE 150 MG PO TABS: 150.0000 mg | ORAL_TABLET | Freq: Once | ORAL | 1 refills | Status: DC

## 2019-02-10 NOTE — Patient Instructions (Addendum)
Zithromax Z-PAK take 2 p.o. day 1 followed by 1 p.o. days 2 through 5.  Note to be out of work for 2 days beginning tomorrow.  Rest and drink plenty of fluids.  May take Chlor-Trimeton 8 mg up to twice daily as needed for runny nose.  Tylenol if needed for sinus pressure.  Recommend COVID-19 testing.

## 2019-02-10 NOTE — Progress Notes (Signed)
   Subjective:    Patient ID: Emma Gonzales, female    DOB: June 25, 1965, 53 y.o.   MRN: [id number]  HPI 53 year old Female seen today by interactive audio and video telecommunications due to coronavirus pandemic.  She is agreeable to visit in this format today.  She is identified using 2 identifiers as [name]. Burruss, VUR, RIS, patient in this practice.  Patient is apparently at work at the present time of this visit.  Says she has been blowing her nose and is felt hot off and on since Sunday, November 29.  Has pressure behind her eyes.  Reports that her temperature is 98.9 degrees.  No sore throat.  No headache.  No nausea and vomiting.  No dysgeusia.  She wants to know what she can safely take to drive her nose.  I recommended Chlor-Trimeton 8 mg.  She tried Mucinex but it did not help.  Reports no nausea vomiting or headache.  Reports malaise and fatigue.  Would like to be out of work for couple of days.  No known COVID-19 exposure but have suggested she get tested at [facility] that she wants to be out of work.    Review of Systems see above     Objective:   Physical Exam  She reports her temperature is 98.9 degrees.  She is seen virtually with the mask on.  She is alert and oriented.  Able to give a clear concise history.  Symptoms are compatible with early maxillary sinusitis.      Assessment & Plan:  Acute maxillary sinusitis  Plan: Zithromax Z-PAK take 2 tablets day 1 followed by 1 tablet days 2 through 5.  Note to be out of work for 48 hours beginning tomorrow.  Advised COVID-19 testing at [facility].  May take Chlor-Trimeton 8 mg every 12 hours as needed for runny nose.  Rest and drink plenty of fluids.  Tylenol if needed for sinus pressure.

## 2019-02-10 NOTE — Telephone Encounter (Signed)
Patient called has a sinusitis and would like to do a virtual visit. When would you like to do it?

## 2019-02-10 NOTE — Telephone Encounter (Signed)
Virtual 11:30 am

## 2019-02-10 NOTE — Telephone Encounter (Signed)
Scheduled appointment

## 2019-02-10 NOTE — Telephone Encounter (Signed)
Called and let Emma Gonzales know that work note is in box outside of door to be picked up.

## 2019-02-10 NOTE — Telephone Encounter (Signed)
LVM to CB ASAP to schedule virtual visit

## 2019-03-31 ENCOUNTER — Other Ambulatory Visit: Payer: Self-pay

## 2019-03-31 ENCOUNTER — Encounter: Payer: Self-pay | Admitting: Orthopaedic Surgery

## 2019-03-31 ENCOUNTER — Ambulatory Visit (INDEPENDENT_AMBULATORY_CARE_PROVIDER_SITE_OTHER): Payer: 59 | Admitting: Orthopaedic Surgery

## 2019-03-31 DIAGNOSIS — M7541 Impingement syndrome of right shoulder: Secondary | ICD-10-CM | POA: Diagnosis not present

## 2019-03-31 MED ORDER — METHYLPREDNISOLONE ACETATE 40 MG/ML IJ SUSP
40.0000 mg | INTRAMUSCULAR | Status: AC | PRN
  Administered 2019-03-31: 16:00:00 40 mg via INTRA_ARTICULAR

## 2019-03-31 MED ORDER — LIDOCAINE HCL 1 % IJ SOLN
3.0000 mL | INTRAMUSCULAR | Status: AC | PRN
  Administered 2019-03-31: 16:00:00 3 mL

## 2019-03-31 NOTE — Progress Notes (Signed)
Office Visit Note   Patient: Emma Gonzales           Date of Birth: [birth year]           MRN: [id number] Visit Date: 03/31/2019              Requested by: Elby Showers, MD [address] [place],  [place] [id number] PCP: Elby Showers, MD   Assessment & Plan: Visit Diagnoses:  1. Impingement syndrome of right shoulder     Plan: Per the patient's wishes I did provide a steroid injection in her right shoulder subacromial space.  I agreed with this treatment plan as well given the fact this is helped her quite a bit in the past.  All question concerns were answered and addressed.  She tolerated the injection well.  Follow-up can be as needed.  Follow-Up Instructions: Return if symptoms worsen or fail to improve.   Orders:  Orders Placed This Encounter  Procedures  . Large Joint Inj   No orders of the defined types were placed in this encounter.     Procedures: Large Joint Inj: R subacromial bursa on 03/31/2019 4:19 PM Indications: pain and diagnostic evaluation Details: 22 G 1.5 in needle  Arthrogram: No  Medications: 3 mL lidocaine 1 %; 40 mg methylPREDNISolone acetate 40 MG/ML Outcome: tolerated well, no immediate complications Procedure, treatment alternatives, risks and benefits explained, specific risks discussed. Consent was given by the patient. Immediately prior to procedure a time out was called to verify the correct patient, procedure, equipment, support staff and site/side marked as required. Patient was prepped and draped in the usual sterile fashion.       Clinical Data: No additional findings.   Subjective: Chief Complaint  Patient presents with  . Right Shoulder - Pain  The patient is well-known to [place].  She has chronic bilateral shoulder pain and is done well with subacromial steroid injections in the past.  We injected her right shoulder in August 2019.  We injected her left shoulder in January 2020.  She has had a flareup of right shoulder  pain.  It was worse last week and not as bad this week but she is still requesting an injection.  Her right shoulder hurts with overhead activities and reaching behind her.  She denies any recent injuries.  She reports no acute changes in her medical status.  She is not a diabetic.  HPI  Review of Systems She currently denies any headache, chest pain, shortness of breath, fever, chills, nausea, vomiting  Objective: Vital Signs: There were no vitals taken for this visit.  Physical Exam She is alert and oriented x3 and in no acute distress Ortho Exam Examination of her right shoulder shows fluid range of motion but positive Neer and Hawkins signs.  There is no weakness in the rotator cuff.  There is definitely signs of impingement. Specialty Comments:  No specialty comments available.  Imaging: No results found.   PMFS History: Patient Active Problem List   Diagnosis Date Noted  . Unilateral primary osteoarthritis, left hip 07/07/2017  . Chronic pain of both knees 07/07/2017  . Chronic pain of left knee 12/25/2016  . Unilateral primary osteoarthritis, left knee 12/25/2016  . Porokeratosis 07/07/2013  . Pain in lower limb 07/07/2013  . Eschar of foot 07/07/2013  . S/P umbilical hernia repair, follow-up exam 01/08/2013  . PAC (premature atrial contraction) 02/05/2012  . Lap gastric bypass Nov 2012 06/19/2011  . Palpitations 06/14/2011  .  History of sleep apnea 06/14/2011  . Obesity 10/17/2010   Past Medical History:  Diagnosis Date  . Anxiety attack 01-24-11   previoulsy and only once  . GERD (gastroesophageal reflux disease) 01-24-11   reflux with hiatal hernia-no regular meds  . Hiatal hernia 01-24-11   hx. of this recent dx./no nerve issues  . Leg pain   . Obesity   . PONV (postoperative nausea and vomiting) 01-24-11   pt. has PONV with anesthesia occ.  . Sleep apnea 01-24-11   no cpap used  . Varicose vein of leg    BILATERAL    Family History  Problem Relation  Age of Onset  . Diabetes Mother   . Heart disease Mother        Not sure what type of heart disease  . Hyperlipidemia Father   . Heart attack Father   . Diabetes Brother     Past Surgical History:  Procedure Laterality Date  . CHOLECYSTECTOMY  01-24-11   '96-Laparoscopic  . Cotton Osteotomy w/ Bone Graft Right 06/16/2015  . ENDOMETRIAL ABLATION W/ NOVASURE    . FOOT SURGERY  01-24-11   bilateral  . GASTRIC ROUX-EN-Y  01/28/2011   Procedure: LAPAROSCOPIC ROUX-EN-Y GASTRIC;  Surgeon: Valarie Merino, MD;  Location: [facility];  Service: General;  Laterality: N/A;  With upper endoscopy  . ORIF METATARSAL FRACTURE Right 07/14/2015  . TUBAL LIGATION    . UMBILICAL HERNIA REPAIR N/A 12/23/2012   Procedure: LAPAROSCOPY  REPAIR  UMBILICAL HERNIA, CLOSURE OF INTERNAL HERNIA DEFECT;  Surgeon: Valarie Merino, MD;  Location: [facility];  Service: General;  Laterality: N/A;   Social History   Occupational History  . Occupation: ORDER Stage manager: [employer]  Tobacco Use  . Smoking status: Never Smoker  . Smokeless tobacco: Never Used  Substance and Sexual Activity  . Alcohol use: Yes    Alcohol/week: 1.0 standard drinks    Types: 1 Cans of beer per week    Comment: one beer every other day   . Drug use: No  . Sexual activity: Yes

## 2019-04-26 ENCOUNTER — Other Ambulatory Visit: Payer: 59 | Admitting: Internal Medicine

## 2019-04-26 ENCOUNTER — Other Ambulatory Visit: Payer: Self-pay

## 2019-04-26 DIAGNOSIS — Z0000 Encounter for general adult medical examination without abnormal findings: Secondary | ICD-10-CM

## 2019-04-26 DIAGNOSIS — E559 Vitamin D deficiency, unspecified: Secondary | ICD-10-CM

## 2019-04-26 DIAGNOSIS — Z9884 Bariatric surgery status: Secondary | ICD-10-CM

## 2019-04-26 DIAGNOSIS — Z1329 Encounter for screening for other suspected endocrine disorder: Secondary | ICD-10-CM

## 2019-04-26 DIAGNOSIS — F329 Major depressive disorder, single episode, unspecified: Secondary | ICD-10-CM

## 2019-04-26 DIAGNOSIS — M1712 Unilateral primary osteoarthritis, left knee: Secondary | ICD-10-CM

## 2019-04-26 DIAGNOSIS — F32A Depression, unspecified: Secondary | ICD-10-CM

## 2019-04-26 DIAGNOSIS — M1612 Unilateral primary osteoarthritis, left hip: Secondary | ICD-10-CM

## 2019-04-26 DIAGNOSIS — I491 Atrial premature depolarization: Secondary | ICD-10-CM

## 2019-04-27 LAB — COMPLETE METABOLIC PANEL WITH GFR
AG Ratio: 1.6 (calc) (ref 1.0–2.5)
ALT: 14 U/L (ref 6–29)
AST: 16 U/L (ref 10–35)
Albumin: 4.1 g/dL (ref 3.6–5.1)
Alkaline phosphatase (APISO): 83 U/L (ref 37–153)
BUN: 16 mg/dL (ref 7–25)
CO2: 30 mmol/L (ref 20–32)
Calcium: 9.4 mg/dL (ref 8.6–10.4)
Chloride: 105 mmol/L (ref 98–110)
Creat: 0.91 mg/dL (ref 0.50–1.05)
GFR, Est African American: 83 mL/min/{1.73_m2} (ref >=60)
GFR, Est Non African American: 72 mL/min/{1.73_m2} (ref >=60)
Globulin: 2.6 g/dL (calc) (ref 1.9–3.7)
Glucose, Bld: 84 mg/dL (ref 65–99)
Potassium: 4.4 mmol/L (ref 3.5–5.3)
Sodium: 143 mmol/L (ref 135–146)
Total Bilirubin: 0.5 mg/dL (ref 0.2–1.2)
Total Protein: 6.7 g/dL (ref 6.1–8.1)

## 2019-04-27 LAB — CBC WITH DIFFERENTIAL/PLATELET
Absolute Monocytes: 484 cells/uL (ref 200–950)
Basophils Absolute: 9 cells/uL (ref 0–200)
Basophils Relative: 0.2 %
Eosinophils Absolute: 40 cells/uL (ref 15–500)
Eosinophils Relative: 0.9 %
HCT: 42 % (ref 35.0–45.0)
Hemoglobin: 13.4 g/dL (ref 11.7–15.5)
Lymphs Abs: 1848 cells/uL (ref 850–3900)
MCH: 28.6 pg (ref 27.0–33.0)
MCHC: 31.9 g/dL — ABNORMAL LOW (ref 32.0–36.0)
MCV: 89.6 fL (ref 80.0–100.0)
MPV: 10 fL (ref 7.5–12.5)
Monocytes Relative: 11 %
Neutro Abs: 2020 cells/uL (ref 1500–7800)
Neutrophils Relative %: 45.9 %
Platelets: 257 10*3/uL (ref 140–400)
RBC: 4.69 10*6/uL (ref 3.80–5.10)
RDW: 14.6 % (ref 11.0–15.0)
Total Lymphocyte: 42 %
WBC: 4.4 10*3/uL (ref 3.8–10.8)

## 2019-04-27 LAB — LIPID PANEL
Cholesterol: 185 mg/dL (ref <200)
HDL: 89 mg/dL (ref >=50)
LDL Cholesterol (Calc): 80 mg/dL (calc)
Non-HDL Cholesterol (Calc): 96 mg/dL (calc) (ref <130)
Total CHOL/HDL Ratio: 2.1 (calc) (ref <5.0)
Triglycerides: 76 mg/dL (ref <150)

## 2019-04-27 LAB — VITAMIN D 25 HYDROXY (VIT D DEFICIENCY, FRACTURES): Vit D, 25-Hydroxy: 23 ng/mL — ABNORMAL LOW (ref 30–100)

## 2019-04-27 LAB — TSH: TSH: 0.82 mIU/L

## 2019-04-30 ENCOUNTER — Encounter: Payer: 59 | Admitting: Internal Medicine

## 2019-05-12 ENCOUNTER — Encounter: Payer: Self-pay | Admitting: Podiatry

## 2019-05-12 ENCOUNTER — Other Ambulatory Visit: Payer: Self-pay

## 2019-05-12 ENCOUNTER — Ambulatory Visit (INDEPENDENT_AMBULATORY_CARE_PROVIDER_SITE_OTHER): Payer: 59 | Admitting: Podiatry

## 2019-05-12 VITALS — BP 125/74 | HR 60 | Temp 97.2°F

## 2019-05-12 DIAGNOSIS — Q828 Other specified congenital malformations of skin: Secondary | ICD-10-CM | POA: Diagnosis not present

## 2019-05-12 DIAGNOSIS — M2042 Other hammer toe(s) (acquired), left foot: Secondary | ICD-10-CM

## 2019-05-12 DIAGNOSIS — M2041 Other hammer toe(s) (acquired), right foot: Secondary | ICD-10-CM

## 2019-05-12 DIAGNOSIS — M79671 Pain in right foot: Secondary | ICD-10-CM | POA: Diagnosis not present

## 2019-05-12 DIAGNOSIS — M79672 Pain in left foot: Secondary | ICD-10-CM

## 2019-05-12 NOTE — Patient Instructions (Signed)

## 2019-05-18 NOTE — Progress Notes (Signed)
Subjective: Emma Gonzales presents today referred by Elby Showers, MD for complaint of painful plantar lesions of both feet.  Pain prevent comfortable ambulation. Aggravating factor is weightbearing with or without shoegear.   She is a former patient of Dr. Caffie Pinto.  She has podiatric surgical history of Cotton osteotomy with graft right foot June 16, 2015.  Past Medical History:  Diagnosis Date  . Anxiety attack 01-24-11   previoulsy and only once  . GERD (gastroesophageal reflux disease) 01-24-11   reflux with hiatal hernia-no regular meds  . Hiatal hernia 01-24-11   hx. of this recent dx./no nerve issues  . Leg pain   . Obesity   . PONV (postoperative nausea and vomiting) 01-24-11   pt. has PONV with anesthesia occ.  . Sleep apnea 01-24-11   no cpap used  . Varicose vein of leg    BILATERAL     Patient Active Problem List   Diagnosis Date Noted  . Unilateral primary osteoarthritis, left hip 07/07/2017  . Chronic pain of both knees 07/07/2017  . Chronic pain of left knee 12/25/2016  . Unilateral primary osteoarthritis, left knee 12/25/2016  . Porokeratosis 07/07/2013  . Pain in lower limb 07/07/2013  . Eschar of foot 07/07/2013  . S/P umbilical hernia repair, follow-up exam 01/08/2013  . PAC (premature atrial contraction) 02/05/2012  . Lap gastric bypass Nov 2012 06/19/2011  . Palpitations 06/14/2011  . History of sleep apnea 06/14/2011  . Obesity 10/17/2010     Past Surgical History:  Procedure Laterality Date  . CHOLECYSTECTOMY  01-24-11   '96-Laparoscopic  . Cotton Osteotomy w/ Bone Graft Right 06/16/2015  . ENDOMETRIAL ABLATION W/ NOVASURE    . FOOT SURGERY  01-24-11   bilateral  . GASTRIC ROUX-EN-Y  01/28/2011   Procedure: LAPAROSCOPIC ROUX-EN-Y GASTRIC;  Surgeon: Pedro Earls, MD;  Location: [facility];  Service: General;  Laterality: N/A;  With upper endoscopy  . ORIF METATARSAL FRACTURE Right 07/14/2015  . TUBAL LIGATION    . UMBILICAL HERNIA REPAIR N/A  12/23/2012   Procedure: LAPAROSCOPY  REPAIR  UMBILICAL HERNIA, CLOSURE OF INTERNAL HERNIA DEFECT;  Surgeon: Pedro Earls, MD;  Location: [facility];  Service: General;  Laterality: N/A;     Current Outpatient Medications on File Prior to Visit  Medication Sig Dispense Refill  . ALPRAZolam (XANAX) 0.5 MG tablet One po  bid prn anxiety 30 tablet 0  . calcium-vitamin D (OSCAL WITH D) 500-200 MG-UNIT per tablet Take 2 tablets by mouth daily with breakfast.    . Cyanocobalamin (B-12) 50 MCG TABS Take 50 mcg by mouth daily.    Marland Kitchen lidocaine (XYLOCAINE) 2 % solution Use as directed 15 mLs in the mouth or throat every 4 (four) hours as needed for mouth pain (or throat pain). 200 mL 0  . Multiple Vitamins-Minerals (MULTIVITAMIN WITH MINERALS) tablet Take 2 tablets by mouth daily.     . naproxen (NAPROSYN) 500 MG tablet TAKE 1 TABLET (500 MG TOTAL) BY MOUTH 2 (TWO) TIMES DAILY BETWEEN MEALS AS NEEDED. 60 tablet 3   No current facility-administered medications on file prior to visit.     Allergies  Allergen Reactions  . Amoxicillin Other (See Comments)    Severe yeast infection Has patient had a PCN reaction causing immediate rash, facial/tongue/throat swelling, SOB or lightheadedness with hypotension: no Has patient had a PCN reaction causing severe rash involving mucus membranes or skin necrosis: No Has patient had a PCN reaction that required hospitalization: No Has  patient had a PCN reaction occurring within the last 10 years: Yes If all of the above answers are "NO", then may proceed with Cephalosporin use.      Social History   Occupational History  . Occupation: ORDER Stage manager: [employer]  Tobacco Use  . Smoking status: Never Smoker  . Smokeless tobacco: Never Used  Substance and Sexual Activity  . Alcohol use: Yes    Alcohol/week: 1.0 standard drinks    Types: 1 Cans of beer per week    Comment: one beer every other day   . Drug use: No  . Sexual activity: Yes      Family History  Problem Relation Age of Onset  . Diabetes Mother   . Heart disease Mother        Not sure what type of heart disease  . Hyperlipidemia Father   . Heart attack Father   . Diabetes Brother      Immunization History  Administered Date(s) Administered  . Influenza-Unspecified 12/17/2013, 12/10/2016  . Tdap 10/13/2014     Objective: Vitals:   05/12/19 1545  BP: 125/74  Pulse: 60  Temp: (!) 97.2 F (36.2 C)    Pt in NAD.  AAO x 3.   Vascular Examination:  Capillary refill time to digits immediate b/l. Palpable DP pulses b/l. Palpable PT pulses b/l. Pedal hair sparse b/l. Skin temperature gradient within normal limits b/l.  Dermatological Examination: Pedal skin with normal turgor, texture and tone bilaterally. No open wounds bilaterally. No interdigital macerations bilaterally. Toenails 1-5 b/l adequate length.  Porokeratotic lesion(s) submet head 3 bilaterallly and plantar rearfoot right foot. No erythema, no edema, no drainage, no flocculence.  Musculoskeletal: Normal muscle strength 5/5 to all lower extremity muscle groups bilaterally, no pain crepitus or joint limitation noted with ROM b/l and contracted digits 2-5 b/l.  Neurological: Protective sensation intact 5/5 intact bilaterally with 10g monofilament b/l Vibratory sensation intact b/l Proprioception intact bilaterally.  Assessment: 1. Porokeratosis   2. Pain in both feet   3. Acquired hammer toes of both feet     Plan: -Foot examination performed today. -Painful porokeratotic lesions submet head 3 b/l and plantar rearfoot right foot pared and enucleated with sterile scalpel blade without incident. -Patient to continue soft, supportive shoe gear daily. -Patient to report any pedal injuries to medical professional immediately. -Patient/POA to call should there be question/concern in the interim.  Return in about 10 weeks (around 07/21/2019).

## 2019-07-27 ENCOUNTER — Ambulatory Visit (INDEPENDENT_AMBULATORY_CARE_PROVIDER_SITE_OTHER): Payer: 59 | Admitting: Podiatry

## 2019-07-27 ENCOUNTER — Encounter: Payer: Self-pay | Admitting: Podiatry

## 2019-07-27 ENCOUNTER — Other Ambulatory Visit: Payer: Self-pay

## 2019-07-27 DIAGNOSIS — M79672 Pain in left foot: Secondary | ICD-10-CM | POA: Diagnosis not present

## 2019-07-27 DIAGNOSIS — Q828 Other specified congenital malformations of skin: Secondary | ICD-10-CM | POA: Diagnosis not present

## 2019-07-27 DIAGNOSIS — M79671 Pain in right foot: Secondary | ICD-10-CM

## 2019-07-27 NOTE — Patient Instructions (Signed)
Hammer Toe  Hammer toe is a change in the shape (a deformity) of your toe. The deformity causes the middle joint of your toe to stay bent. This causes pain, especially when you are wearing shoes. Hammer toe starts gradually. At first, the toe can be straightened. Gradually over time, the deformity becomes stiff and permanent. Early treatments to keep the toe straight may relieve pain. As the deformity becomes stiff and permanent, surgery may be needed to straighten the toe. What are the causes? Hammer toe is caused by abnormal bending of the toe joint that is closest to your foot. It happens gradually over time. This pulls on the muscles and connections (tendons) of the toe joint, making them weak and stiff. It is often related to wearing shoes that are too short or narrow and do not let your toes straighten. What increases the risk? You may be at greater risk for hammer toe if you:  Are female.  Are older.  Wear shoes that are too small.  Wear high-heeled shoes that pinch your toes.  Are a ballet dancer.  Have a second toe that is longer than your big toe (first toe).  Injure your foot or toe.  Have arthritis.  Have a family history of hammer toe.  Have a nerve or muscle disorder. What are the signs or symptoms? The main symptoms of this condition are pain and deformity of the toe. The pain is worse when wearing shoes, walking, or running. Other symptoms may include:  Corns or calluses over the bent part of the toe or between the toes.  Redness and a burning feeling on the toe.  An open sore that forms on the top of the toe.  Not being able to straighten the toe. How is this diagnosed? This condition is diagnosed based on your symptoms and a physical exam. During the exam, your health care provider will try to straighten your toe to see how stiff the deformity is. You may also have tests, such as:  A blood test to check for rheumatoid arthritis.  An X-ray to show how  severe the deformity is. How is this treated? Treatment for this condition will depend on how stiff the deformity is. Surgery is often needed. However, sometimes a hammer toe can be straightened without surgery. Treatments that do not involve surgery include:  Taping the toe into a straightened position.  Using pads and cushions to protect the toe (orthotics).  Wearing shoes that provide enough room for the toes.  Doing toe-stretching exercises at home.  Taking an NSAID to reduce pain and swelling. If these treatments do not help or the toe cannot be straightened, surgery is the next option. The most common surgeries used to straighten a hammer toe include:  Arthroplasty. In this procedure, part of the joint is removed, and that allows the toe to straighten.  Fusion. In this procedure, cartilage between the two bones of the joint is taken out and the bones are fused together into one longer bone.  Implantation. In this procedure, part of the bone is removed and replaced with an implant to let the toe move again.  Flexor tendon transfer. In this procedure, the tendons that curl the toes down (flexor tendons) are repositioned. Follow these instructions at home:  Take over-the-counter and prescription medicines only as told by your health care provider.  Do toe straightening and stretching exercises as told by your health care provider.  Keep all follow-up visits as told by your health care   provider. This is important. How is this prevented?  Wear shoes that give your toes enough room and do not cause pain.  Do not wear high-heeled shoes. Contact a health care provider if:  Your pain gets worse.  Your toe becomes red or swollen.  You develop an open sore on your toe. This information is not intended to replace advice given to you by your health care provider. Make sure you discuss any questions you have with your health care provider. Document Revised: 02/07/2017 Document  Reviewed: 06/21/2015 Elsevier Patient Education  2020 [employer] Inc[employer]  

## 2019-08-02 NOTE — Progress Notes (Signed)
Subjective: Emma Gonzales is a 54 y.o. female patient seen today painful plantar lesions b/l.  Pain prevent comfortable ambulation. Aggravating factor is weightbearing with or without shoegear.   She voices no new pedal problems on today's visit.  Patient Active Problem List   Diagnosis Date Noted  . Unilateral primary osteoarthritis, left hip 07/07/2017  . Chronic pain of both knees 07/07/2017  . Chronic pain of left knee 12/25/2016  . Unilateral primary osteoarthritis, left knee 12/25/2016  . Porokeratosis 07/07/2013  . Pain in lower limb 07/07/2013  . Eschar of foot 07/07/2013  . S/P umbilical hernia repair, follow-up exam 01/08/2013  . PAC (premature atrial contraction) 02/05/2012  . Lap gastric bypass Nov 2012 06/19/2011  . Palpitations 06/14/2011  . History of sleep apnea 06/14/2011  . Obesity 10/17/2010    Current Outpatient Medications on File Prior to Visit  Medication Sig Dispense Refill  . ALPRAZolam (XANAX) 0.5 MG tablet One po  bid prn anxiety 30 tablet 0  . calcium-vitamin D (OSCAL WITH D) 500-200 MG-UNIT per tablet Take 2 tablets by mouth daily with breakfast.    . Cyanocobalamin (B-12) 50 MCG TABS Take 50 mcg by mouth daily.    . fluconazole (DIFLUCAN) 150 MG tablet Take 150 mg by mouth once.    . lidocaine (XYLOCAINE) 2 % solution Use as directed 15 mLs in the mouth or throat every 4 (four) hours as needed for mouth pain (or throat pain). 200 mL 0  . metroNIDAZOLE (FLAGYL) 500 MG tablet Take 500 mg by mouth 2 (two) times daily.    . Multiple Vitamins-Minerals (MULTIVITAMIN WITH MINERALS) tablet Take 2 tablets by mouth daily.     . naproxen (NAPROSYN) 500 MG tablet TAKE 1 TABLET (500 MG TOTAL) BY MOUTH 2 (TWO) TIMES DAILY BETWEEN MEALS AS NEEDED. 60 tablet 3   No current facility-administered medications on file prior to visit.    Allergies  Allergen Reactions  . Amoxicillin Other (See Comments)    Severe yeast infection Has patient had a PCN reaction causing  immediate rash, facial/tongue/throat swelling, SOB or lightheadedness with hypotension: no Has patient had a PCN reaction causing severe rash involving mucus membranes or skin necrosis: No Has patient had a PCN reaction that required hospitalization: No Has patient had a PCN reaction occurring within the last 10 years: Yes If all of the above answers are "NO", then may proceed with Cephalosporin use.     Objective: Physical Exam  General: Emma Gonzales is a pleasant 54 y.o.  African American female, in NAD. AAO x 3.   Vascular:  Neurovascular status unchanged b/l. Capillary refill time to digits immediate b/l. Palpable DP pulses b/l. Palpable PT pulses b/l. Pedal hair sparse b/l. Skin temperature gradient within normal limits b/l. No edema noted b/l.  Dermatological:  Pedal skin with normal turgor, texture and tone bilaterally. No open wounds bilaterally. No interdigital macerations bilaterally. Porokeratotic lesion(s) plantar rearfoot right foot, submet head 3 left foot and submet head 3 right foot. No erythema, no edema, no drainage, no flocculence.  Musculoskeletal:  Normal muscle strength 5/5 to all lower extremity muscle groups bilaterally. No pain crepitus or joint limitation noted with ROM b/l. Hammertoes noted to the 2-5 bilaterally.  Neurological:  Protective sensation intact 5/5 intact bilaterally with 10g monofilament b/l. Vibratory sensation intact b/l. Proprioception intact bilaterally.  Assessment and Plan:  1. Porokeratosis   2. Pain in both feet    -Examined patient. -No new findings. No new orders. -Painful  porokeratotic lesion(s) plantar rearfoot right foot, submet head 3 left foot and submet head 3 right foot pared and enucleated with sterile scalpel blade without incident. -Patient to continue soft, supportive shoe gear daily. -Patient to report any pedal injuries to medical professional immediately. -Patient/POA to call should there be question/concern in the  interim.  Return in about 10 weeks (around 10/05/2019) for nail and callus trim.  Marzetta Board, DPM

## 2019-10-05 ENCOUNTER — Encounter: Payer: Self-pay | Admitting: Podiatry

## 2019-10-05 ENCOUNTER — Ambulatory Visit (INDEPENDENT_AMBULATORY_CARE_PROVIDER_SITE_OTHER): Payer: 59 | Admitting: Podiatry

## 2019-10-05 ENCOUNTER — Other Ambulatory Visit: Payer: Self-pay

## 2019-10-05 DIAGNOSIS — Q828 Other specified congenital malformations of skin: Secondary | ICD-10-CM

## 2019-10-05 DIAGNOSIS — M79672 Pain in left foot: Secondary | ICD-10-CM

## 2019-10-05 DIAGNOSIS — M79671 Pain in right foot: Secondary | ICD-10-CM

## 2019-10-08 NOTE — Progress Notes (Signed)
Subjective: Emma Gonzales is a 54 y.o. female patient seen today painful plantar lesions b/l.  Pain preventscomfortable ambulation. Aggravating factor is weightbearing with or without shoegear.   She voices no new pedal problems on today's visit.  Patient Active Problem List   Diagnosis Date Noted  . Unilateral primary osteoarthritis, left hip 07/07/2017  . Chronic pain of both knees 07/07/2017  . Chronic pain of left knee 12/25/2016  . Unilateral primary osteoarthritis, left knee 12/25/2016  . Porokeratosis 07/07/2013  . Pain in lower limb 07/07/2013  . Eschar of foot 07/07/2013  . S/P umbilical hernia repair, follow-up exam 01/08/2013  . PAC (premature atrial contraction) 02/05/2012  . Lap gastric bypass Nov 2012 06/19/2011  . Palpitations 06/14/2011  . History of sleep apnea 06/14/2011  . Obesity 10/17/2010    Current Outpatient Medications on File Prior to Visit  Medication Sig Dispense Refill  . ALPRAZolam (XANAX) 0.5 MG tablet One po  bid prn anxiety 30 tablet 0  . calcium-vitamin D (OSCAL WITH D) 500-200 MG-UNIT per tablet Take 2 tablets by mouth daily with breakfast.    . Cyanocobalamin (B-12) 50 MCG TABS Take 50 mcg by mouth daily.    . fluconazole (DIFLUCAN) 150 MG tablet Take 150 mg by mouth once.    . lidocaine (XYLOCAINE) 2 % solution Use as directed 15 mLs in the mouth or throat every 4 (four) hours as needed for mouth pain (or throat pain). 200 mL 0  . metroNIDAZOLE (FLAGYL) 500 MG tablet Take 500 mg by mouth 2 (two) times daily.    . Multiple Vitamins-Minerals (MULTIVITAMIN WITH MINERALS) tablet Take 2 tablets by mouth daily.     . naproxen (NAPROSYN) 500 MG tablet TAKE 1 TABLET (500 MG TOTAL) BY MOUTH 2 (TWO) TIMES DAILY BETWEEN MEALS AS NEEDED. 60 tablet 3   No current facility-administered medications on file prior to visit.    Allergies  Allergen Reactions  . Amoxicillin Other (See Comments)    Severe yeast infection Has patient had a PCN reaction causing  immediate rash, facial/tongue/throat swelling, SOB or lightheadedness with hypotension: no Has patient had a PCN reaction causing severe rash involving mucus membranes or skin necrosis: No Has patient had a PCN reaction that required hospitalization: No Has patient had a PCN reaction occurring within the last 10 years: Yes If all of the above answers are "NO", then may proceed with Cephalosporin use.     Objective: Physical Exam: No changes to physical examination on today's visit.  General: Emma Gonzales is a pleasant 54 y.o.  African American female, in NAD. AAO x 3.   Vascular:  Neurovascular status unchanged b/l. Capillary refill time to digits immediate b/l. Palpable DP pulses b/l. Palpable PT pulses b/l. Pedal hair sparse b/l. Skin temperature gradient within normal limits b/l. No edema noted b/l.  Dermatological:  Pedal skin with normal turgor, texture and tone bilaterally. No open wounds bilaterally. No interdigital macerations bilaterally. Porokeratotic lesion(s) plantar rearfoot right foot, submet head 3 left foot and submet head 3 right foot. No erythema, no edema, no drainage, no flocculence.  Musculoskeletal:  Normal muscle strength 5/5 to all lower extremity muscle groups bilaterally. No pain crepitus or joint limitation noted with ROM b/l. Hammertoes noted to the 2-5 bilaterally.  Neurological:  Protective sensation intact 5/5 intact bilaterally with 10g monofilament b/l. Vibratory sensation intact b/l. Proprioception intact bilaterally.  Assessment and Plan:  1. Porokeratosis   2. Pain in both feet    -Examined patient. -  No new findings. No new orders. -Painful porokeratotic lesion(s) plantar rearfoot right foot, submet head 3 left foot and submet head 3 right foot pared and enucleated with sterile scalpel blade without incident. -Patient to continue soft, supportive shoe gear daily. -Patient to report any pedal injuries to medical professional  immediately. -Patient/POA to call should there be question/concern in the interim.  Return in about 9 weeks (around 12/07/2019) for callus trim.  Freddie Breech, DPM

## 2019-12-06 ENCOUNTER — Ambulatory Visit: Payer: Self-pay

## 2019-12-06 ENCOUNTER — Ambulatory Visit (INDEPENDENT_AMBULATORY_CARE_PROVIDER_SITE_OTHER): Payer: 59 | Admitting: Orthopaedic Surgery

## 2019-12-06 DIAGNOSIS — M1711 Unilateral primary osteoarthritis, right knee: Secondary | ICD-10-CM | POA: Insufficient documentation

## 2019-12-06 DIAGNOSIS — M25561 Pain in right knee: Secondary | ICD-10-CM

## 2019-12-06 DIAGNOSIS — M1712 Unilateral primary osteoarthritis, left knee: Secondary | ICD-10-CM

## 2019-12-06 MED ORDER — METHYLPREDNISOLONE ACETATE 40 MG/ML IJ SUSP
40.0000 mg | INTRAMUSCULAR | Status: AC | PRN
  Administered 2019-12-06: 40 mg via INTRA_ARTICULAR

## 2019-12-06 MED ORDER — LIDOCAINE HCL 1 % IJ SOLN
3.0000 mL | INTRAMUSCULAR | Status: AC | PRN
  Administered 2019-12-06: 3 mL

## 2019-12-06 NOTE — Progress Notes (Signed)
Office Visit Note   Patient: Emma Gonzales           Date of Birth: 02-03-1966           MRN: [id number] Visit Date: 12/06/2019              Requested by: [name], MD [address] [place],  [place] [address] PCP: [name], MD   Assessment & Plan: Visit Diagnoses:  1. Right knee pain, unspecified chronicity   2. Unilateral primary osteoarthritis, right knee     Plan: I did place a steroid injection in her right knee which she tolerated well.  She is definitely candidate for hyaluronic acid for that knee and a hinged knee brace.  We will see her back in 4 weeks to hopefully place hyaluronic acid into the right knee.  At that visit we need a weight and BMI calculation for the chart.  All questions and concerns were answered and addressed.  Follow-Up Instructions: Return in about 4 weeks (around 01/03/2020).   Orders:  Orders Placed This Encounter  Procedures  . Large Joint Inj  . XR Knee 1-2 Views Right   No orders of the defined types were placed in this encounter.     Procedures: Large Joint Inj: R knee on 12/06/2019 9:56 AM Indications: diagnostic evaluation and pain Details: 22 G 1.5 in needle, superolateral approach  Arthrogram: No  Medications: 3 mL lidocaine 1 %; 40 mg methylPREDNISolone acetate 40 MG/ML Outcome: tolerated well, no immediate complications Procedure, treatment alternatives, risks and benefits explained, specific risks discussed. Consent was given by the patient. Immediately prior to procedure a time out was called to verify the correct patient, procedure, equipment, support staff and site/side marked as required. Patient was prepped and draped in the usual sterile fashion.       Clinical Data: No additional findings.   Subjective: Chief Complaint  Patient presents with  . Right Knee - Pain  The patient is a 54 year old female that I seen before.  She comes in for worsening right knee pain.  She says her right knee hurts  quite a bit in the left knee feels like skin to get out.  She has been doing a lot of her time with her knees.  She understands that weight does play into it and she is morbidly obese with a weight of 276 pounds her last recorded weight.  She has had no other acute change in medical status.  HPI  Review of Systems She currently denies any headache, chest pain, shortness of breath, fever, chills, nausea, vomiting  Objective: Vital Signs: There were no vitals taken for this visit.  Physical Exam She is alert and orient x3 and in no acute distress Ortho Exam examination of her right knee does show valgus malalignment with a painful arc of motion of her knee.  There is significant grinding of the patellofemoral joint as well as medial and lateral joint line tenderness more so on the lateral side.  Range of motion is full Specialty Comments:  No specialty comments available.  Imaging: XR Knee 1-2 Views Right  Result Date: 12/06/2019 2 views of the right knee compared to previous x-rays show worsening tricompartment arthritis with valgus malalignment, significant lateral joint space narrowing as well as para-articular osteophytes in all 3 compartments.    PMFS History: Patient Active Problem List   Diagnosis Date Noted  . Unilateral primary osteoarthritis, right knee 12/06/2019  . Unilateral primary osteoarthritis, left hip  07/07/2017  . Chronic pain of both knees 07/07/2017  . Chronic pain of left knee 12/25/2016  . Unilateral primary osteoarthritis, left knee 12/25/2016  . Porokeratosis 07/07/2013  . Pain in lower limb 07/07/2013  . Eschar of foot 07/07/2013  . S/P umbilical hernia repair, follow-up exam 01/08/2013  . PAC (premature atrial contraction) 02/05/2012  . Lap gastric bypass Nov 2012 06/19/2011  . Palpitations 06/14/2011  . History of sleep apnea 06/14/2011  . Obesity 10/17/2010   Past Medical History:  Diagnosis Date  . Anxiety attack 01-24-11   previoulsy and only  once  . GERD (gastroesophageal reflux disease) 01-24-11   reflux with hiatal hernia-no regular meds  . Hiatal hernia 01-24-11   hx. of this recent dx./no nerve issues  . Leg pain   . Obesity   . PONV (postoperative nausea and vomiting) 01-24-11   pt. has PONV with anesthesia occ.  . Sleep apnea 01-24-11   no cpap used  . Varicose vein of leg    BILATERAL    Family History  Problem Relation Age of Onset  . Diabetes Mother   . Heart disease Mother        Not sure what type of heart disease  . Hyperlipidemia Father   . Heart attack Father   . Diabetes Brother     Past Surgical History:  Procedure Laterality Date  . CHOLECYSTECTOMY  01-24-11   '96-Laparoscopic  . Cotton Osteotomy w/ Bone Graft Right 06/16/2015  . ENDOMETRIAL ABLATION W/ NOVASURE    . FOOT SURGERY  01-24-11   bilateral  . GASTRIC ROUX-EN-Y  01/28/2011   Procedure: LAPAROSCOPIC ROUX-EN-Y GASTRIC;  Surgeon: Valarie Merino, MD;  Location: [facility];  Service: General;  Laterality: N/A;  With upper endoscopy  . ORIF METATARSAL FRACTURE Right 07/14/2015  . TUBAL LIGATION    . UMBILICAL HERNIA REPAIR N/A 12/23/2012   Procedure: LAPAROSCOPY  REPAIR  UMBILICAL HERNIA, CLOSURE OF INTERNAL HERNIA DEFECT;  Surgeon: Valarie Merino, MD;  Location: [facility];  Service: General;  Laterality: N/A;   Social History   Occupational History  . Occupation: ORDER Stage manager: [employer]  Tobacco Use  . Smoking status: Never Smoker  . Smokeless tobacco: Never Used  Substance and Sexual Activity  . Alcohol use: Yes    Alcohol/week: 1.0 standard drink    Types: 1 Cans of beer per week    Comment: one beer every other day   . Drug use: No  . Sexual activity: Yes

## 2019-12-07 ENCOUNTER — Telehealth: Payer: Self-pay

## 2019-12-07 NOTE — Telephone Encounter (Signed)
Noted  

## 2019-12-07 NOTE — Telephone Encounter (Signed)
Can we please see if we can get patent approved for right knee gel injection?

## 2019-12-10 NOTE — Telephone Encounter (Signed)
Can you please submit these for me please?  Thank you

## 2019-12-10 NOTE — Telephone Encounter (Signed)
Submitted for VOB for Durolane-Right knee 

## 2019-12-13 ENCOUNTER — Telehealth: Payer: Self-pay

## 2019-12-13 NOTE — Telephone Encounter (Signed)
Approved for Durolane-Right knee Dr. Kathrin Greathouse and Annette Stable No copay 35% oop NO Prior auth required     [facility] to schedule @ next available

## 2019-12-13 NOTE — Telephone Encounter (Signed)
Lvm for pt to call back to inform 

## 2020-01-03 ENCOUNTER — Encounter: Payer: Self-pay | Admitting: Podiatry

## 2020-01-03 ENCOUNTER — Other Ambulatory Visit: Payer: Self-pay

## 2020-01-03 ENCOUNTER — Ambulatory Visit (INDEPENDENT_AMBULATORY_CARE_PROVIDER_SITE_OTHER): Payer: 59 | Admitting: Podiatry

## 2020-01-03 ENCOUNTER — Ambulatory Visit: Payer: 59 | Admitting: Orthopaedic Surgery

## 2020-01-03 DIAGNOSIS — M79671 Pain in right foot: Secondary | ICD-10-CM

## 2020-01-03 DIAGNOSIS — M79672 Pain in left foot: Secondary | ICD-10-CM

## 2020-01-03 DIAGNOSIS — Q828 Other specified congenital malformations of skin: Secondary | ICD-10-CM | POA: Diagnosis not present

## 2020-01-06 ENCOUNTER — Encounter: Payer: Self-pay | Admitting: Orthopaedic Surgery

## 2020-01-06 ENCOUNTER — Ambulatory Visit (INDEPENDENT_AMBULATORY_CARE_PROVIDER_SITE_OTHER): Payer: 59 | Admitting: Orthopaedic Surgery

## 2020-01-06 DIAGNOSIS — M1711 Unilateral primary osteoarthritis, right knee: Secondary | ICD-10-CM | POA: Diagnosis not present

## 2020-01-06 MED ORDER — HYDROCODONE-ACETAMINOPHEN 5-325 MG PO TABS: 1.0000 | ORAL_TABLET | Freq: Four times a day (QID) | ORAL | 0 refills | Status: DC | PRN

## 2020-01-06 MED ORDER — SODIUM HYALURONATE 60 MG/3ML IX PRSY
60.0000 mg | PREFILLED_SYRINGE | INTRA_ARTICULAR | Status: AC | PRN
  Administered 2020-01-06: 60 mg via INTRA_ARTICULAR

## 2020-01-06 NOTE — Progress Notes (Signed)
   Procedure Note  Patient: Emma Gonzales             Date of Birth: 1966/02/04           MRN: [id number]             Visit Date: 01/06/2020  Procedures: Visit Diagnoses:  1. Unilateral primary osteoarthritis, right knee     Large Joint Inj: R knee on 01/06/2020 4:42 PM Indications: diagnostic evaluation and pain Details: 22 G 1.5 in needle, superolateral approach  Arthrogram: No  Medications: 60 mg Sodium Hyaluronate 60 MG/3ML Outcome: tolerated well, no immediate complications Procedure, treatment alternatives, risks and benefits explained, specific risks discussed. Consent was given by the patient. Immediately prior to procedure a time out was called to verify the correct patient, procedure, equipment, support staff and site/side marked as required. Patient was prepped and draped in the usual sterile fashion.    The patient is here today for scheduled hyaluronic acid injection in her right knee to treat the pain from osteoarthritis.  She is having a hard time with the knee.  She has tried failed other forms conservative treatment.  Today's injection is with Durolane.  There is no effusion about the right knee today.  When I put the first dose of Durolane in the knee only got about a small one third amount in because of the needle and the compression of this because the dural Lane to express outside the needle.  Had inject her second time with a full dose of Durolane.  I was confident we got at least a syringe and a half of Durolane into the knee which is a good amount.  I explained the risk and benefits of these types of injections and how we hope it helps treat the pain from osteoarthritis.  I will send in some hydrocodone to help with her acute pain from the injection.  All question concerns were answered addressed.  We will see her back in 3 months to see if this is helped any.  We still have encouraged her weight loss.  At her next visit I would like a weight and BMI calculation.

## 2020-01-08 NOTE — Progress Notes (Signed)
Subjective: Emma Gonzales is a 54 y.o. female patient seen today painful plantar lesions b/l.  Pain preventscomfortable ambulation. Aggravating factor is weightbearing with or without shoegear.   She voices no new pedal problems on today's visit.  Patient Active Problem List   Diagnosis Date Noted  . Unilateral primary osteoarthritis, right knee 12/06/2019  . Unilateral primary osteoarthritis, left hip 07/07/2017  . Chronic pain of both knees 07/07/2017  . Chronic pain of left knee 12/25/2016  . Unilateral primary osteoarthritis, left knee 12/25/2016  . Porokeratosis 07/07/2013  . Pain in lower limb 07/07/2013  . Eschar of foot 07/07/2013  . S/P umbilical hernia repair, follow-up exam 01/08/2013  . PAC (premature atrial contraction) 02/05/2012  . Lap gastric bypass Nov 2012 06/19/2011  . Palpitations 06/14/2011  . History of sleep apnea 06/14/2011  . Obesity 10/17/2010    Current Outpatient Medications on File Prior to Visit  Medication Sig Dispense Refill  . ALPRAZolam (XANAX) 0.5 MG tablet One po  bid prn anxiety 30 tablet 0  . calcium-vitamin D (OSCAL WITH D) 500-200 MG-UNIT per tablet Take 2 tablets by mouth daily with breakfast.    . Cyanocobalamin (B-12) 50 MCG TABS Take 50 mcg by mouth daily.    . fluconazole (DIFLUCAN) 150 MG tablet Take 150 mg by mouth once.    . lidocaine (XYLOCAINE) 2 % solution Use as directed 15 mLs in the mouth or throat every 4 (four) hours as needed for mouth pain (or throat pain). 200 mL 0  . metroNIDAZOLE (FLAGYL) 500 MG tablet Take 500 mg by mouth 2 (two) times daily.    . Multiple Vitamins-Minerals (MULTIVITAMIN WITH MINERALS) tablet Take 2 tablets by mouth daily.     . naproxen (NAPROSYN) 500 MG tablet TAKE 1 TABLET (500 MG TOTAL) BY MOUTH 2 (TWO) TIMES DAILY BETWEEN MEALS AS NEEDED. 60 tablet 3   No current facility-administered medications on file prior to visit.    Allergies  Allergen Reactions  . Amoxicillin Other (See Comments)     Severe yeast infection Has patient had a PCN reaction causing immediate rash, facial/tongue/throat swelling, SOB or lightheadedness with hypotension: no Has patient had a PCN reaction causing severe rash involving mucus membranes or skin necrosis: No Has patient had a PCN reaction that required hospitalization: No Has patient had a PCN reaction occurring within the last 10 years: Yes If all of the above answers are "NO", then may proceed with Cephalosporin use.     Objective: Physical Exam: No changes to physical examination on today's visit.  General: Emma Gonzales is a pleasant 54 y.o.  African American female, in NAD. AAO x 3.   Vascular:  Neurovascular status unchanged b/l. Capillary refill time to digits immediate b/l. Palpable DP pulses b/l. Palpable PT pulses b/l. Pedal hair sparse b/l. Skin temperature gradient within normal limits b/l. No edema noted b/l.  Dermatological:  Pedal skin with normal turgor, texture and tone bilaterally. No open wounds bilaterally. No interdigital macerations bilaterally. Porokeratotic lesion(s) plantar rearfoot right foot, submet head 3 left foot and submet head 3 right foot. No erythema, no edema, no drainage, no flocculence.  Musculoskeletal:  Normal muscle strength 5/5 to all lower extremity muscle groups bilaterally. No pain crepitus or joint limitation noted with ROM b/l. Hammertoes noted to the 2-5 bilaterally.  Neurological:  Protective sensation intact 5/5 intact bilaterally with 10g monofilament b/l. Vibratory sensation intact b/l. Proprioception intact bilaterally.  Assessment and Plan:  1. Porokeratosis   2. Pain  in both feet    -Examined patient. -No new findings. No new orders. -Painful porokeratotic lesion(s) plantar rearfoot right foot, submet head 3 left foot and submet head 3 right foot pared and enucleated with sterile scalpel blade without incident. -Patient to continue soft, supportive shoe gear daily. -Patient to report any  pedal injuries to medical professional immediately. -Patient/POA to call should there be question/concern in the interim.  Return in about 3 months (around 04/04/2020) for painful corn(s)/callus(es).  Freddie Breech, DPM

## 2020-01-20 ENCOUNTER — Other Ambulatory Visit (INDEPENDENT_AMBULATORY_CARE_PROVIDER_SITE_OTHER): Payer: Self-pay | Admitting: Orthopaedic Surgery

## 2020-03-17 ENCOUNTER — Other Ambulatory Visit: Payer: Self-pay

## 2020-03-17 ENCOUNTER — Telehealth (INDEPENDENT_AMBULATORY_CARE_PROVIDER_SITE_OTHER): Payer: 59 | Admitting: Internal Medicine

## 2020-03-17 ENCOUNTER — Encounter: Payer: Self-pay | Admitting: Internal Medicine

## 2020-03-17 ENCOUNTER — Telehealth: Payer: Self-pay | Admitting: Internal Medicine

## 2020-03-17 VITALS — Temp 98.6°F

## 2020-03-17 DIAGNOSIS — U071 COVID-19: Secondary | ICD-10-CM

## 2020-03-17 DIAGNOSIS — J019 Acute sinusitis, unspecified: Secondary | ICD-10-CM

## 2020-03-17 NOTE — Progress Notes (Signed)
   Subjective:    Patient ID: Emma Gonzales, female    DOB: [birth year], 55 y.o.   MRN: [id number]  HPI 55 year old Female seen today via interactive audio and video telecommunications due to Coronavirus pandemic.  Patient is in her car and I am in my office.  She is identified using 2 identifiers as Emma Gonzales , a patient in this practice.  She is agreeable to visit in this format today.    Patient works at [employer] and says she was tested for COVID-19 today at work because of her symptoms.  She says she supposed to get results back within the hour.  She says she has been vaccinated for COVID-19 but we do not have dates of vaccines in Epic.   Patient complaining of sinus pressure.  Had fever on Wednesday and Thursday of this week.  Has sneezing and runny nose.  Her grandson has had cold symptoms.  She had her second COVID-vaccine in April she says.   Review of Systems denies nausea, vomiting, dysgeusia, shaking chills.  Has respiratory congestion and nasal congestion.  Denies significant cough.     Objective:   Physical Exam Says she does not have fever at the present time.  Sounds nasally congested when she speaks.  Seen virtually in her car.       Assessment & Plan:  Patient was contacted at 6 pm today. She was told by nurse at work she has Covid-19.  Has been prescribed Zithromax Z-PAK 2 p.o. day 1 followed by 1 p.o. days 2 through 5.  Quarantine at home x 5 days minimum and call me if she has worse symptoms. Needs to check pulse ox.Says she is not nauseated.

## 2020-03-17 NOTE — Telephone Encounter (Signed)
scheduled

## 2020-03-17 NOTE — Telephone Encounter (Signed)
Scheduled at 4:30pm.  

## 2020-03-17 NOTE — Telephone Encounter (Signed)
Call us back with Covid results. Can she do virtual?

## 2020-03-17 NOTE — Telephone Encounter (Signed)
Emma Gonzales [phone]  [name] called to say she has sinus pressure , she had fever on Wednesday and yesterday, she is sneezing and has runny nose, was tested for COVID-19 should have results in about an hour. No fever today, Had 2nd COVID vaccine in April, No COVID exposure that she knows of, but her grandson has a cold but has not been tested for anything.

## 2020-03-20 ENCOUNTER — Other Ambulatory Visit: Payer: Self-pay | Admitting: Internal Medicine

## 2020-03-20 ENCOUNTER — Telehealth: Payer: Self-pay | Admitting: Internal Medicine

## 2020-03-20 ENCOUNTER — Encounter: Payer: Self-pay | Admitting: Internal Medicine

## 2020-03-20 MED ORDER — AZITHROMYCIN 250 MG PO TABS: ORAL_TABLET | ORAL | 0 refills | Status: DC

## 2020-03-20 NOTE — Telephone Encounter (Addendum)
Emma Gonzales called to say the pharmacy had not received her prescription from Friday's appointment.  Called patient. She is out of work for 7-10 days per nurse at work. It seems I failed to send in Z-pak. Have sent it today. She sounds nasally congested but is not SOB. She tested positive for Covid through her work.

## 2020-04-11 ENCOUNTER — Ambulatory Visit (INDEPENDENT_AMBULATORY_CARE_PROVIDER_SITE_OTHER): Payer: 59 | Admitting: Podiatry

## 2020-04-11 ENCOUNTER — Other Ambulatory Visit: Payer: Self-pay

## 2020-04-11 ENCOUNTER — Encounter: Payer: Self-pay | Admitting: Podiatry

## 2020-04-11 DIAGNOSIS — Q828 Other specified congenital malformations of skin: Secondary | ICD-10-CM

## 2020-04-11 DIAGNOSIS — M79671 Pain in right foot: Secondary | ICD-10-CM | POA: Diagnosis not present

## 2020-04-11 DIAGNOSIS — M79672 Pain in left foot: Secondary | ICD-10-CM | POA: Diagnosis not present

## 2020-04-11 NOTE — Progress Notes (Signed)
Subjective: Emma Gonzales is a 55 y.o. female patient seen today painful plantar lesions b/l.  Pain preventscomfortable ambulation. Aggravating factor is weightbearing with or without shoegear. Patient works long hours at [employer].   She voices no new pedal problems on today's visit.  Patient Active Problem List   Diagnosis Date Noted  . Unilateral primary osteoarthritis, right knee 12/06/2019  . Unilateral primary osteoarthritis, left hip 07/07/2017  . Chronic pain of both knees 07/07/2017  . Chronic pain of left knee 12/25/2016  . Unilateral primary osteoarthritis, left knee 12/25/2016  . Symptomatic mammary hypertrophy 08/07/2016  . Porokeratosis 07/07/2013  . Pain in lower limb 07/07/2013  . Eschar of foot 07/07/2013  . Lipodystrophy 05/28/2013  . Ptosis of breast 05/28/2013  . S/P umbilical hernia repair, follow-up exam 01/08/2013  . PAC (premature atrial contraction) 02/05/2012  . Lap gastric bypass Nov 2012 06/19/2011  . Palpitations 06/14/2011  . History of sleep apnea 06/14/2011  . Obesity 10/17/2010    Current Outpatient Medications on File Prior to Visit  Medication Sig Dispense Refill  . ALPRAZolam (XANAX) 0.5 MG tablet One po  bid prn anxiety 30 tablet 0  . azithromycin (ZITHROMAX) 250 MG tablet 2 tabs by mouth day 1 followed by one tab by mouth days 2-5 6 tablet 0  . calcium-vitamin D (OSCAL WITH D) 500-200 MG-UNIT per tablet Take 2 tablets by mouth daily with breakfast.    . Cyanocobalamin (B-12) 50 MCG TABS Take 50 mcg by mouth daily.    . fluconazole (DIFLUCAN) 150 MG tablet TAKE 1 TABLET BY MOUTH AS ONE DOSE 1 tablet 1  . HYDROcodone-acetaminophen (NORCO/VICODIN) 5-325 MG tablet Take 1 tablet by mouth every 6 (six) hours as needed for moderate pain. 30 tablet 0  . lidocaine (XYLOCAINE) 2 % solution Use as directed 15 mLs in the mouth or throat every 4 (four) hours as needed for mouth pain (or throat pain). 200 mL 0  . metroNIDAZOLE (FLAGYL) 500 MG  tablet Take 500 mg by mouth 2 (two) times daily.    . Multiple Vitamins-Minerals (MULTIVITAMIN WITH MINERALS) tablet Take 2 tablets by mouth daily.     . naproxen (NAPROSYN) 500 MG tablet TAKE 1 TABLET (500 MG TOTAL) BY MOUTH 2 (TWO) TIMES DAILY BETWEEN MEALS AS NEEDED. 60 tablet 3   No current facility-administered medications on file prior to visit.    Allergies  Allergen Reactions  . Amoxicillin Other (See Comments)    Severe yeast infection Has patient had a PCN reaction causing immediate rash, facial/tongue/throat swelling, SOB or lightheadedness with hypotension: no Has patient had a PCN reaction causing severe rash involving mucus membranes or skin necrosis: No Has patient had a PCN reaction that required hospitalization: No Has patient had a PCN reaction occurring within the last 10 years: Yes If all of the above answers are "NO", then may proceed with Cephalosporin use.     Objective: Physical Exam: No changes to physical examination on today's visit.  General: Emma Gonzales is a pleasant 55 y.o.  African American female, in NAD. AAO x 3.   Vascular:  Neurovascular status unchanged b/l. Capillary refill time to digits immediate b/l. Palpable DP pulses b/l. Palpable PT pulses b/l. Pedal hair sparse b/l. Skin temperature gradient within normal limits b/l. No edema noted b/l.  Dermatological:  Pedal skin with normal turgor, texture and tone bilaterally. No open wounds bilaterally. No interdigital macerations bilaterally. Toenails 1-5 b/l well maintained with adequate length. No erythema, no  edema, no drainage, no fluctuance. Porokeratotic lesion(s) b/l lower extremities, submet head 3 right foot, submet head 4 left foot and left plantar midfoot. No erythema, no edema, no drainage, no fluctuance.  Musculoskeletal:  Normal muscle strength 5/5 to all lower extremity muscle groups bilaterally. No pain crepitus or joint limitation noted with ROM b/l. Hammertoes noted to the 2-5  bilaterally.  Neurological:  Protective sensation intact 5/5 intact bilaterally with 10g monofilament b/l. Vibratory sensation intact b/l. Proprioception intact bilaterally.  Assessment and Plan:  1. Porokeratosis   2. Pain in both feet    -Examined patient. -Patient to continue soft, supportive shoe gear daily. -Painful porokeratotic lesion(s) b/l lower extremities, submet head 3 left foot, submet head 4 left foot and left plantar midfoot pared and enucleated with sterile scalpel blade without incident. -Patient to report any pedal injuries to medical professional immediately. -Patient/POA to call should there be question/concern in the interim.  Return in about 3 months (around 07/09/2020).  Freddie Breech, DPM

## 2020-04-13 ENCOUNTER — Telehealth: Payer: Self-pay | Admitting: Orthopaedic Surgery

## 2020-04-13 NOTE — Telephone Encounter (Signed)
Received $25.00 cash and disability paperwork from patient    Forwarding to [employer] today °

## 2020-07-06 ENCOUNTER — Telehealth: Payer: Self-pay | Admitting: Orthopaedic Surgery

## 2020-07-06 NOTE — Telephone Encounter (Signed)
Patient called. Says the FMLA forms has the wrong dates on them. Would like someone to call her. [phone]

## 2020-07-10 NOTE — Telephone Encounter (Signed)
Emma Gonzales called & spoke with pt on Friday. Patient was advised to come in to discuss as she was not understanding what Emma Gonzales was saying over the phone.

## 2020-07-25 ENCOUNTER — Ambulatory Visit (INDEPENDENT_AMBULATORY_CARE_PROVIDER_SITE_OTHER): Payer: 59 | Admitting: Podiatry

## 2020-07-25 ENCOUNTER — Other Ambulatory Visit: Payer: Self-pay

## 2020-07-25 ENCOUNTER — Encounter: Payer: Self-pay | Admitting: Podiatry

## 2020-07-25 DIAGNOSIS — Q828 Other specified congenital malformations of skin: Secondary | ICD-10-CM | POA: Diagnosis not present

## 2020-07-25 DIAGNOSIS — M79671 Pain in right foot: Secondary | ICD-10-CM

## 2020-07-25 DIAGNOSIS — M79672 Pain in left foot: Secondary | ICD-10-CM

## 2020-07-30 NOTE — Progress Notes (Signed)
  Subjective:  Patient ID: Emma Gonzales, female    DOB: Nov 11, 1965,  MRN: [id number]  Emma Gonzales presents to clinic today for painful porokeratotic lesions b/l feet.  Pain prevent comfortable ambulation. Aggravating factor is weightbearing with or without shoegear.   She voices no new pedal problems on today's visit.  PCP is Dr. Marlan Palau and last visit was 03/17/2020.  Allergies  Allergen Reactions  . Amoxicillin Other (See Comments)    Severe yeast infection Has patient had a PCN reaction causing immediate rash, facial/tongue/throat swelling, SOB or lightheadedness with hypotension: no Has patient had a PCN reaction causing severe rash involving mucus membranes or skin necrosis: No Has patient had a PCN reaction that required hospitalization: No Has patient had a PCN reaction occurring within the last 10 years: Yes If all of the above answers are "NO", then may proceed with Cephalosporin use.     Review of Systems: Negative except as noted in the HPI. Objective:   Constitutional Emma Gonzales is a pleasant 55 y.o. African American female, morbidly obese in NAD. AAO x 3.   Vascular Capillary refill time to digits immediate b/l. Palpable pedal pulses b/l LE. Pedal hair sparse. Lower extremity skin temperature gradient within normal limits. No pain with calf compression b/l. No cyanosis or clubbing noted.  Neurologic Normal speech. Oriented to person, place, and time. Protective sensation intact 5/5 intact bilaterally with 10g monofilament b/l. Vibratory sensation intact b/l.  Dermatologic Pedal skin with normal turgor, texture and tone bilaterally. No open wounds bilaterally. No interdigital macerations bilaterally. Toenails 1-5 b/l well maintained with adequate length. No erythema, no edema, no drainage, no fluctuance. Porokeratotic lesion(s) submet head 3 right foot, submet head 4 left foot and left plantar midfoot. No erythema, no edema, no drainage, no fluctuance.  Orthopedic:  Normal muscle strength 5/5 to all lower extremity muscle groups bilaterally. No pain crepitus or joint limitation noted with ROM b/l. Hammertoe(s) noted to the 2-5 bilaterally.   Radiographs: None Assessment:   1. Porokeratosis   2. Pain in both feet    Plan:  Patient was evaluated and treated and all questions answered. -Examined patient. -No new findings. No new orders. -Patient to continue soft, supportive shoe gear daily. -Painful porokeratotic lesion(s) submet head 3 right foot, submet head 4 left foot and left plantar midfoot pared and enucleated with sterile scalpel blade without incident. Total number of lesions debrided=3. -Patient to report any pedal injuries to medical professional immediately. -Patient/POA to call should there be question/concern in the interim.  Return in about 3 months (around 10/25/2020).  Freddie Breech, DPM

## 2020-08-16 ENCOUNTER — Other Ambulatory Visit (INDEPENDENT_AMBULATORY_CARE_PROVIDER_SITE_OTHER): Payer: Self-pay | Admitting: Orthopaedic Surgery

## 2020-08-17 NOTE — Telephone Encounter (Signed)
Ok to refill 

## 2020-08-17 NOTE — Telephone Encounter (Signed)
Please advise 

## 2020-08-23 ENCOUNTER — Other Ambulatory Visit: Payer: Self-pay

## 2020-08-23 ENCOUNTER — Encounter: Payer: Self-pay | Admitting: Orthopaedic Surgery

## 2020-08-23 ENCOUNTER — Ambulatory Visit (INDEPENDENT_AMBULATORY_CARE_PROVIDER_SITE_OTHER): Payer: 59 | Admitting: Orthopaedic Surgery

## 2020-08-23 VITALS — Ht 64.0 in | Wt 268.0 lb

## 2020-08-23 DIAGNOSIS — M1712 Unilateral primary osteoarthritis, left knee: Secondary | ICD-10-CM | POA: Diagnosis not present

## 2020-08-23 DIAGNOSIS — G8929 Other chronic pain: Secondary | ICD-10-CM

## 2020-08-23 DIAGNOSIS — M25562 Pain in left knee: Secondary | ICD-10-CM | POA: Diagnosis not present

## 2020-08-23 DIAGNOSIS — M1711 Unilateral primary osteoarthritis, right knee: Secondary | ICD-10-CM | POA: Diagnosis not present

## 2020-08-23 DIAGNOSIS — M25561 Pain in right knee: Secondary | ICD-10-CM

## 2020-08-23 NOTE — Progress Notes (Signed)
The patient is well-known to me.  She has significant arthritis in both her knees.  Right now her left knee hurts much worse than the right knee.  Both knees on exam have valgus malalignment.  She has had hyaluronic acid injections and she said that really has not helped much.  She is now not interested in steroid injections either.  Last year when we weighed her she weighed 276 pounds.  Today she is at 268 pounds but she still has a BMI of 46.  She is only 55 years old.  Her knees do hurt on a daily basis and they are detrimentally affecting her mobility, her quality of life and her actives daily living.  She states that she has seen or is seeing a weight loss specialist.  I would like to see her lose some more weight before considering knee replacement surgery.  She seems to be heading in the right direction and she understands this as well.  I would like to see her back in 3 months for repeat weight and BMI calculation.  If she is losing more weight, I would feel more comfortable scheduling her for surgery.  All questions and concerns were answered and addressed.

## 2020-10-25 ENCOUNTER — Ambulatory Visit (INDEPENDENT_AMBULATORY_CARE_PROVIDER_SITE_OTHER): Payer: 59 | Admitting: Podiatry

## 2020-10-25 ENCOUNTER — Encounter: Payer: Self-pay | Admitting: Podiatry

## 2020-10-25 ENCOUNTER — Other Ambulatory Visit: Payer: Self-pay

## 2020-10-25 DIAGNOSIS — Q828 Other specified congenital malformations of skin: Secondary | ICD-10-CM | POA: Diagnosis not present

## 2020-10-25 DIAGNOSIS — M79671 Pain in right foot: Secondary | ICD-10-CM

## 2020-10-25 DIAGNOSIS — M79672 Pain in left foot: Secondary | ICD-10-CM | POA: Diagnosis not present

## 2020-10-27 ENCOUNTER — Encounter: Payer: 59 | Admitting: Internal Medicine

## 2020-10-27 ENCOUNTER — Encounter: Payer: Self-pay | Admitting: Internal Medicine

## 2020-10-27 ENCOUNTER — Other Ambulatory Visit: Payer: Self-pay

## 2020-10-27 ENCOUNTER — Ambulatory Visit (INDEPENDENT_AMBULATORY_CARE_PROVIDER_SITE_OTHER): Payer: 59 | Admitting: Internal Medicine

## 2020-10-27 ENCOUNTER — Telehealth: Payer: Self-pay | Admitting: Internal Medicine

## 2020-10-27 VITALS — HR 119 | Temp 100.5°F

## 2020-10-27 DIAGNOSIS — J029 Acute pharyngitis, unspecified: Secondary | ICD-10-CM | POA: Diagnosis not present

## 2020-10-27 DIAGNOSIS — R0981 Nasal congestion: Secondary | ICD-10-CM

## 2020-10-27 DIAGNOSIS — R6883 Chills (without fever): Secondary | ICD-10-CM

## 2020-10-27 MED ORDER — NIRMATRELVIR/RITONAVIR (PAXLOVID)TABLET: 3.0000 | ORAL_TABLET | Freq: Two times a day (BID) | ORAL | 0 refills | Status: AC

## 2020-10-27 MED ORDER — BENZONATATE 100 MG PO CAPS: 100.0000 mg | ORAL_CAPSULE | Freq: Three times a day (TID) | ORAL | 0 refills | Status: DC | PRN

## 2020-10-27 NOTE — Telephone Encounter (Signed)
scheduled

## 2020-10-27 NOTE — Telephone Encounter (Signed)
Emma Gonzales [phone]  Wynell called to say she started getting sick on Wednesday night, headache, head congestion, chills, sore throat, she sounds bad. She did COVID test on Wednesday night and this morning and both were negative. Has had 2 vaccines.

## 2020-10-29 NOTE — Progress Notes (Signed)
Subjective: Emma Gonzales is a pleasant 55 y.o. female patient seen today for painful porokeratotic lesions plantar aspect of both feet.  Pain prevent comfortable ambulation. Aggravating factor is weightbearing with or without shoegear.   She voices no new pedal problems on today's visit.  PCP is Baxley, Luanna Cole, MD. Last visit was: 03/17/2020.  Allergies  Allergen Reactions   Amoxicillin Other (See Comments)    Severe yeast infection Has patient had a PCN reaction causing immediate rash, facial/tongue/throat swelling, SOB or lightheadedness with hypotension: no Has patient had a PCN reaction causing severe rash involving mucus membranes or skin necrosis: No Has patient had a PCN reaction that required hospitalization: No Has patient had a PCN reaction occurring within the last 10 years: Yes If all of the above answers are "NO", then may proceed with Cephalosporin use.     Objective: Physical Exam  General: Emma Gonzales is a pleasant 55 y.o. African American female, morbidly obese in NAD. AAO x 3.   Vascular:  Capillary refill time to digits immediate b/l. Palpable DP pulse(s) b/l lower extremities Palpable PT pulse(s) b/l lower extremities Pedal hair sparse. Lower extremity skin temperature gradient within normal limits. No pain with calf compression b/l. No edema noted b/l lower extremities.  Dermatological:  Toenails 1-5 bilaterally well maintained with adequate length. No erythema, no edema, no drainage, no fluctuance. Porokeratotic lesion(s) submet head 3 right foot, submet head 4 left foot, and left plantar midfoot. No erythema, no edema, no drainage, no fluctuance.  Musculoskeletal:  Normal muscle strength 5/5 to all lower extremity muscle groups bilaterally. No pain crepitus or joint limitation noted with ROM b/l lower extremities. Hammertoe(s) noted to the 2-5 bilaterally.  Neurological:  Protective sensation intact 5/5 intact bilaterally with 10g monofilament b/l.  Vibratory sensation intact b/l.  Assessment and Plan:  1. Porokeratosis   2. Pain in both feet   -Examined patient. -Patient to continue soft, supportive shoe gear daily. -Painful porokeratotic lesion(s) submet head 3 right foot, submet head 4 left foot, and left plantar midfoot pared and enucleated with sterile scalpel blade without incident. Total number of lesions debrided=3. -Patient to report any pedal injuries to medical professional immediately. -Patient/POA to call should there be question/concern in the interim.  Return in about 3 months (around 01/25/2021).  Freddie Breech, DPM

## 2020-10-30 ENCOUNTER — Telehealth: Payer: Self-pay | Admitting: Internal Medicine

## 2020-10-30 NOTE — Progress Notes (Signed)
   Subjective:    Patient ID: Emma Gonzales, female    DOB: [birth year], 55 y.o.   MRN: [id number]  HPI 55 year old Female seen today with chills, sore throat, head congestion, chills, sore throat.  She did a COVID test on the day she had onset of symptoms which was Wednesday, August 17.  She did another test on August 19.  Both of these test were negative.  She has had 2 COVID-19 immunizations.  She had COVID-19 in January 2022 and was treated through [facility].    Review of Systems see above     Objective:   Physical Exam  Pulse ox 97% Temp 100.5 degrees pulse 119 and regular.  She looks weak and fatigued.  Pharynx injected without exudate.  TMs clear.  Chest clear to auscultation without rales or wheezing.         Assessment & Plan:   Probable acute Covid 19 virus infection despite negative home test.  Plan: Paxlovid prescribed(regular dose). Rest at home. Note to be excused from work e-mailed. Call if symptoms do not improve or worsen.  Stay well-hydrated.  Monitor pulse oximetry at home.  Tessalon Perles 100 mg up to 3 times daily as needed for cough.

## 2020-10-30 NOTE — Telephone Encounter (Signed)
Emailed work note to [employer] to be out of work until 10/31/2020

## 2020-10-30 NOTE — Patient Instructions (Addendum)
Paxlovid prescribed. Stay well hydrated. Note to be out of work e-mailed to patient.  Monitor pulse oximetry at home.  Call if symptoms worsen.  Tessalon Perles 100 mg to take up to 3 times daily as needed for cough  Addendum: August 22,2022: She never took another Covid test. She took Paxlovid and improved. She is feeling much better now. CMA spoke with her her by phone today.

## 2020-11-01 ENCOUNTER — Telehealth: Payer: Self-pay | Admitting: Internal Medicine

## 2020-11-01 NOTE — Telephone Encounter (Signed)
Emma Gonzales [phone]  Emma Gonzales was unable to go back to work, she is still congested and now has red swollen eye that is very sore.

## 2020-11-23 ENCOUNTER — Ambulatory Visit: Payer: 59 | Admitting: Orthopaedic Surgery

## 2021-01-19 ENCOUNTER — Other Ambulatory Visit: Payer: Self-pay

## 2021-01-19 ENCOUNTER — Ambulatory Visit: Payer: 59 | Admitting: Cardiology

## 2021-01-19 ENCOUNTER — Encounter: Payer: Self-pay | Admitting: Cardiology

## 2021-01-19 VITALS — BP 140/71 | HR 63 | Resp 16 | Ht 64.0 in | Wt 273.0 lb

## 2021-01-19 DIAGNOSIS — R072 Precordial pain: Secondary | ICD-10-CM

## 2021-01-19 DIAGNOSIS — I499 Cardiac arrhythmia, unspecified: Secondary | ICD-10-CM

## 2021-01-19 DIAGNOSIS — F1010 Alcohol abuse, uncomplicated: Secondary | ICD-10-CM

## 2021-01-19 DIAGNOSIS — Z6841 Body Mass Index (BMI) 40.0 and over, adult: Secondary | ICD-10-CM

## 2021-01-19 DIAGNOSIS — Z9884 Bariatric surgery status: Secondary | ICD-10-CM

## 2021-01-19 NOTE — Progress Notes (Signed)
Date:  01/19/2021   ID:  [name], DOB [birth year], MRN [id number]  PCP:  [name], MD  Cardiologist:  Tessa Lerner, DO, [facility] (established care 01/19/2021) Former Cardiology Providers: Altamese Monee, APRN, FNP-C  REASON FOR CONSULT: Irregular heart beat and chest discomfort  REQUESTING PHYSICIAN:  [name], MD [address] [place],  [place] [address]  Chief Complaint  Patient presents with   Irregular Heart Beat   New Patient (Initial Visit)    HPI  Emma Gonzales is a 55 y.o. African-American female who presents to the office with a chief complaint of "chest discomfort and irregular heart beat." Patient's past medical history and cardiovascular risk factors include: Obesity due to excess calories, history of sleep apnea not on CPAP, alcohol use.  She is referred to the office at the request of [name], MD for evaluation of chest discomfort and irregular heartbeat.  Chest discomfort: First event happened approximately 2 weeks ago while she was at work she was lifting objects when she started experiencing a right-sided precordial pain, 3 out of 10 in intensity, nonradiating, lasted for about 2 hours, self-limited.  The discomfort is now brought on by effort related activities and does not go away with resting.  And no reoccurrence of symptoms since the index event.  Irregular heartbeat: Patient states that she is not having palpitations per se but when her heart is auscultated by providers they do notice irregular heartbeats.  No prior history of atrial fibrillation.  No near-syncope or syncopal events.  She consumes 1 cup of coffee per day.  And approximately 48 ounces of beer 4 days a week.  No use of sodas, over-the-counter medications, smoking, illicit drugs, energy drinks, and no known history of thyroid or anemia.  Patient's blood pressure is elevated at today's office visit.  She does not check blood pressures at home.  She also has a prior history  of sleep apnea and chose not to be on therapy.   No family history of premature coronary disease or sudden cardiac death.  FUNCTIONAL STATUS: No structured exercise program or daily routine.   ALLERGIES: Allergies  Allergen Reactions   Amoxicillin Other (See Comments)    Severe yeast infection Has patient had a PCN reaction causing immediate rash, facial/tongue/throat swelling, SOB or lightheadedness with hypotension: no Has patient had a PCN reaction causing severe rash involving mucus membranes or skin necrosis: No Has patient had a PCN reaction that required hospitalization: No Has patient had a PCN reaction occurring within the last 10 years: Yes If all of the above answers are "NO", then may proceed with Cephalosporin use.     MEDICATION LIST PRIOR TO VISIT: Current Meds  Medication Sig   hydrocortisone 2.5 % cream Apply 1 application topically 2 (two) times daily.   naproxen (NAPROSYN) 500 MG tablet TAKE 1 TABLET (500 MG TOTAL) BY MOUTH 2 (TWO) TIMES DAILY BETWEEN MEALS AS NEEDED.   Vitamin D, Ergocalciferol, (DRISDOL) 1.25 MG (50000 UNIT) CAPS capsule Take 50,000 Units by mouth once a week.     PAST MEDICAL HISTORY: Past Medical History:  Diagnosis Date   Anxiety attack 01-24-11   previoulsy and only once   GERD (gastroesophageal reflux disease) 01-24-11   reflux with hiatal hernia-no regular meds   Hiatal hernia 01-24-11   hx. of this recent dx./no nerve issues   Leg pain    Obesity    PONV (postoperative nausea and vomiting) 01-24-11   pt. has PONV  with anesthesia occ.   Sleep apnea 01-24-11   no cpap used   Varicose vein of leg    BILATERAL    PAST SURGICAL HISTORY: Past Surgical History:  Procedure Laterality Date   CHOLECYSTECTOMY  01-24-11   '96-Laparoscopic   Cotton Osteotomy w/ Bone Graft Right 06/16/2015   ENDOMETRIAL ABLATION W/ NOVASURE     FOOT SURGERY  01-24-11   bilateral   GASTRIC ROUX-EN-Y  01/28/2011   Procedure: LAPAROSCOPIC ROUX-EN-Y  GASTRIC;  Surgeon: Pedro Earls, MD;  Location: [facility];  Service: General;  Laterality: N/A;  With upper endoscopy   ORIF METATARSAL FRACTURE Right 07/14/2015   TUBAL LIGATION     UMBILICAL HERNIA REPAIR N/A 12/23/2012   Procedure: LAPAROSCOPY  REPAIR  UMBILICAL HERNIA, CLOSURE OF INTERNAL HERNIA DEFECT;  Surgeon: Pedro Earls, MD;  Location: [facility];  Service: General;  Laterality: N/A;    FAMILY HISTORY: The patient family history includes Diabetes in her brother and mother; Heart attack in her father; Heart disease in her mother; Hyperlipidemia in her father.  SOCIAL HISTORY:  The patient  reports that she has never smoked. She has never used smokeless tobacco. She reports that she does not currently use alcohol after a past usage of about 1.0 standard drink per week. She reports that she does not use drugs.  REVIEW OF SYSTEMS: Review of Systems  Constitutional: Negative for chills and fever.  HENT:  Negative for hoarse voice and nosebleeds.   Eyes:  Negative for discharge, double vision and pain.  Cardiovascular:  Positive for chest pain (2weeks ago, no active pain) and palpitations. Negative for claudication, dyspnea on exertion, leg swelling, near-syncope, orthopnea, paroxysmal nocturnal dyspnea and syncope.  Respiratory:  Positive for snoring. Negative for hemoptysis and shortness of breath.   Musculoskeletal:  Negative for muscle cramps and myalgias.  Gastrointestinal:  Negative for abdominal pain, constipation, diarrhea, hematemesis, hematochezia, melena, nausea and vomiting.  Neurological:  Negative for dizziness and light-headedness.   PHYSICAL EXAM: Vitals with BMI 01/19/2021 10/27/2020 08/23/2020  Height 5\' 4"  - 5\' 4"   Weight 273 lbs - 268 lbs  BMI Q000111Q - [id number]  Systolic [id number] - -  Diastolic 71 - -  Pulse 63 119 -    CONSTITUTIONAL: Well-developed and well-nourished. No acute distress.  SKIN: Skin is warm and dry. No rash noted. No cyanosis. No pallor. No  jaundice HEAD: Normocephalic and atraumatic.  EYES: No scleral icterus MOUTH/THROAT: Moist oral membranes.  NECK: No JVD present. No thyromegaly noted. No carotid bruits  LYMPHATIC: No visible cervical adenopathy.  CHEST Normal respiratory effort. No intercostal retractions  LUNGS: Clear to auscultation bilaterally.  No stridor. No wheezes. No rales.  CARDIOVASCULAR: Regular rate and rhythm, positive Q000111Q, soft holosystolic murmur heard at the apex, no rubs or gallops appreciated. ABDOMINAL: Obese, soft, nontender, nondistended, positive bowel sounds in all 4 quadrants, no apparent ascites.  EXTREMITIES: No peripheral edema, warm to touch, palpable DP and PT pulses HEMATOLOGIC: No significant bruising NEUROLOGIC: Oriented to person, place, and time. Nonfocal. Normal muscle tone.  PSYCHIATRIC: Normal mood and affect. Normal behavior. Cooperative  CARDIAC DATABASE: EKG: 01/19/2021: Normal sinus rhythm, 67 bpm, normal axis, occasional PVCs, LAE, nonspecific T wave abnormality.    Echocardiogram: 04/27/2017: 1 LVEF 63%, mild LVH, severely dilated left atrium, mild to moderate MR, mild TR, RVSP 30 mmHg.  Stress Testing: No results found for this or any previous visit from the past 1095 days.  Heart Catheterization: None  LABORATORY DATA: CBC  Latest Ref Rng & Units 04/26/2019 06/09/2017 11/22/2014  WBC 3.8 - 10.8 Thousand/uL 4.4 5.2 5.9  Hemoglobin 11.7 - 15.5 g/dL 16.9 67.8 93.8  Hematocrit 35.0 - 45.0 % 42.0 37.6 39.2  Platelets 140 - 400 Thousand/uL 257 276 226    CMP Latest Ref Rng & Units 04/26/2019 06/09/2017 11/22/2014  Glucose 65 - 99 mg/dL 84 74 71  BUN 7 - 25 mg/dL 16 13 12   Creatinine 0.50 - 1.05 mg/dL 1.01 7.51  Sodium 135 - 146 mmol/L 143 144 142  Potassium 3.5 - 5.3 mmol/L 4.4 4.7 4.4  Chloride 98 - 110 mmol/L 105 111(H) 109  CO2 20 - 32 mmol/L 30 26 25   Calcium 8.6 - 10.4 mg/dL 9.4 8.8 8.7  Total Protein 6.1 - 8.1 g/dL 6.7 6.3 6.2  Total Bilirubin 0.2 - 1.2 mg/dL  0.5 0.4 0.3  Alkaline Phos 33 - 115 U/L - - 65  AST 10 - 35 U/L 16 17 20   ALT 6 - 29 U/L 14 15 17     Lipid Panel     Component Value Date/Time   CHOL 185 04/26/2019 0932   TRIG 76 04/26/2019 0932   HDL 89 04/26/2019 0932   CHOLHDL 2.1 04/26/2019 0932   VLDL 11 08/06/2012 0930   LDLCALC 80 04/26/2019 0932    No components found for: NTPROBNP No results for input(s): PROBNP in the last 8760 hours. No results for input(s): TSH in the last 8760 hours.  BMP No results for input(s): NA, K, CL, CO2, GLUCOSE, BUN, CREATININE, CALCIUM, GFRNONAA, GFRAA in the last 8760 hours.  HEMOGLOBIN A1C Lab Results  Component Value Date   HGBA1C 6.4 (H) 10/24/2010   MPG 137 (H) 10/24/2010    IMPRESSION:    ICD-10-CM   1. Precordial pain  R07.2 PCV CARDIAC STRESS TEST    PCV ECHOCARDIOGRAM COMPLETE    2. Irregular heartbeat  I49.9 EKG 12-Lead    LONG TERM MONITOR (3-14 DAYS)    Basic metabolic panel    Hemoglobin and hematocrit, blood    Magnesium    TSH    3. Alcohol abuse  F10.10     4. Hx of gastric bypass  Z98.84     5. Class 3 severe obesity due to excess calories without serious comorbidity with body mass index (BMI) of 45.0 to 49.9 in adult [facility])  E66.01    Z68.42        RECOMMENDATIONS: Emma Gonzales is a 55 y.o. African-American female whose past medical history and cardiac risk factors include: Obesity due to excess calories, history of sleep apnea not on CPAP, alcohol use.  Precordial pain Appears to be noncardiac based on symptoms. She has risk factors as outlined above. Recommended echocardiogram to evaluate for structural heart disease and GXT to evaluate for functional status and exercise-induced ischemia. Further recommendations to follow  Irregular heartbeat I suspect that she has underlying PACs and PVCs that she is able to appreciate intermittently. However, prior echocardiogram notes dilated left atrium which given her comorbid conditions predisposes her  for possible atrial fibrillation. She is also encouraged on the importance of decreasing her alcohol consumption and if possible complete cessation.  She is drinking more than the recommended amount of alcohol. Plan 3-day extended Holter monitor to evaluate for dysrhythmias  Class 3 severe obesity due to excess calories without serious comorbidity with body mass index (BMI) of 45.0 to 49.9 in adult [facility]) Body mass index is 46.86 kg/m. I reviewed with the  patient the importance of diet, regular physical activity/exercise, weight loss.   Patient is educated on increasing physical activity gradually as tolerated.  With the goal of moderate intensity exercise for 30 minutes a day 5 days a week.  She is encouraged to keep a log of her blood pressures and to review the numbers with either myself or her PCP.  I suspect that she may have underlying hypertension which is undiagnosed/untreated.  Patient is agreeable with the plan of care.  As part of this initial consultation reviewed outside records provided by PCP which included office note, EKG, labs these findings have been summarized and noted above for further reference.  Discussed disease management, ordering diagnostic testing, coordination of care and patient education provided as a part of today's encounter.  FINAL MEDICATION LIST END OF ENCOUNTER: No orders of the defined types were placed in this encounter.   Medications Discontinued During This Encounter  Medication Reason   ALPRAZolam (XANAX) 0.5 MG tablet Error   benzonatate (TESSALON) 100 MG capsule Error   calcium-vitamin D (OSCAL WITH D) 500-200 MG-UNIT per tablet Error   Cyanocobalamin (B-12) 50 MCG TABS Error   fluocinonide (LIDEX) 0.05 % external solution Error   HYDROcodone-acetaminophen (NORCO/VICODIN) 5-325 MG tablet Error   lidocaine (XYLOCAINE) 2 % solution Error   metFORMIN (GLUCOPHAGE) 1000 MG tablet Error   Multiple Vitamins-Minerals (MULTIVITAMIN WITH MINERALS) tablet  Error   phentermine (ADIPEX-P) 37.5 MG tablet Error     Current Outpatient Medications:    hydrocortisone 2.5 % cream, Apply 1 application topically 2 (two) times daily., Disp: , Rfl:    naproxen (NAPROSYN) 500 MG tablet, TAKE 1 TABLET (500 MG TOTAL) BY MOUTH 2 (TWO) TIMES DAILY BETWEEN MEALS AS NEEDED., Disp: 60 tablet, Rfl: 3   Vitamin D, Ergocalciferol, (DRISDOL) 1.25 MG (50000 UNIT) CAPS capsule, Take 50,000 Units by mouth once a week., Disp: , Rfl:   Orders Placed This Encounter  Procedures   Basic metabolic panel   Hemoglobin and hematocrit, blood   Magnesium   TSH   PCV CARDIAC STRESS TEST   LONG TERM MONITOR (3-14 DAYS)   EKG 12-Lead   PCV ECHOCARDIOGRAM COMPLETE    There are no Patient Instructions on file for this visit.   --Continue cardiac medications as reconciled in final medication list. --Return in about 4 weeks (around 02/16/2021) for Reevaluation of, Chest pain, Palpitations, Review test results. Or sooner if needed. --Continue follow-up with your primary care physician regarding the management of your other chronic comorbid conditions.  Patient's questions and concerns were addressed to her satisfaction. She voices understanding of the instructions provided during this encounter.   This note was created using a voice recognition software as a result there may be grammatical errors inadvertently enclosed that do not reflect the nature of this encounter. Every attempt is made to correct such errors.  Rex Kras, [place], [facility]  Pager: [phone] Office: [phone]

## 2021-01-20 LAB — BASIC METABOLIC PANEL
BUN/Creatinine Ratio: 14 (ref 9–23)
BUN: 12 mg/dL (ref 6–24)
CO2: 24 mmol/L (ref 20–29)
Calcium: 9.2 mg/dL (ref 8.7–10.2)
Chloride: 102 mmol/L (ref 96–106)
Creatinine, Ser: 0.86 mg/dL (ref 0.57–1.00)
Glucose: 78 mg/dL (ref 70–99)
Potassium: 4.5 mmol/L (ref 3.5–5.2)
Sodium: 144 mmol/L (ref 134–144)
eGFR: 80 mL/min/{1.73_m2} (ref >59)

## 2021-01-20 LAB — HEMOGLOBIN AND HEMATOCRIT, BLOOD
Hematocrit: 37.2 % (ref 34.0–46.6)
Hemoglobin: 12 g/dL (ref 11.1–15.9)

## 2021-01-20 LAB — MAGNESIUM: Magnesium: 2.2 mg/dL (ref 1.6–2.3)

## 2021-01-20 LAB — TSH: TSH: 0.775 u[IU]/mL (ref 0.450–4.500)

## 2021-01-21 ENCOUNTER — Other Ambulatory Visit (INDEPENDENT_AMBULATORY_CARE_PROVIDER_SITE_OTHER): Payer: Self-pay | Admitting: Orthopaedic Surgery

## 2021-01-22 NOTE — Progress Notes (Signed)
Pt called back and was given lab results.  

## 2021-01-22 NOTE — Progress Notes (Signed)
Called pt no answer, left a vm

## 2021-01-29 ENCOUNTER — Inpatient Hospital Stay: Payer: 59

## 2021-01-29 ENCOUNTER — Other Ambulatory Visit: Payer: Self-pay

## 2021-01-29 ENCOUNTER — Ambulatory Visit: Payer: 59

## 2021-01-29 DIAGNOSIS — R072 Precordial pain: Secondary | ICD-10-CM

## 2021-01-29 DIAGNOSIS — I499 Cardiac arrhythmia, unspecified: Secondary | ICD-10-CM

## 2021-01-31 ENCOUNTER — Ambulatory Visit: Payer: 59 | Admitting: Podiatry

## 2021-02-07 ENCOUNTER — Telehealth: Payer: Self-pay

## 2021-02-07 NOTE — Telephone Encounter (Signed)
I called patient to see if she can give [place] her monitor #. There is one that hasn't been registered.

## 2021-02-16 ENCOUNTER — Encounter: Payer: Self-pay | Admitting: Cardiology

## 2021-02-16 ENCOUNTER — Other Ambulatory Visit: Payer: Self-pay

## 2021-02-16 ENCOUNTER — Ambulatory Visit: Payer: 59 | Admitting: Cardiology

## 2021-02-16 VITALS — BP 123/78 | HR 75 | Resp 16 | Ht 64.0 in | Wt 265.0 lb

## 2021-02-16 DIAGNOSIS — I499 Cardiac arrhythmia, unspecified: Secondary | ICD-10-CM

## 2021-02-16 DIAGNOSIS — F1010 Alcohol abuse, uncomplicated: Secondary | ICD-10-CM

## 2021-02-16 DIAGNOSIS — R072 Precordial pain: Secondary | ICD-10-CM

## 2021-02-16 DIAGNOSIS — Z712 Person consulting for explanation of examination or test findings: Secondary | ICD-10-CM

## 2021-02-16 DIAGNOSIS — Z6841 Body Mass Index (BMI) 40.0 and over, adult: Secondary | ICD-10-CM

## 2021-02-16 DIAGNOSIS — Z9884 Bariatric surgery status: Secondary | ICD-10-CM

## 2021-02-16 NOTE — Progress Notes (Signed)
Date:  02/16/2021   ID:  Emma Gonzales, DOB 03/15/1965, MRN [id number]  PCP:  Elby Showers, MD  Cardiologist:  Rex Kras, DO, [facility] (established care 01/19/2021) Former Cardiology Providers: Jeri Lager, APRN, FNP-C  Date: 02/16/21 Last Office Visit: 01/19/2021  Chief Complaint  Patient presents with   Chest Pain   Palpitations   Results    Test results    HPI  Emma Gonzales is a 55 y.o. African-American female who presents to the office with a chief complaint of "reevaluation of chest pain and discuss test results." Patient's past medical history and cardiovascular risk factors include: Obesity due to excess calories, history of sleep apnea not on CPAP, alcohol use.  She is referred to the office at the request of Elby Showers, MD for evaluation of chest discomfort and irregular heartbeat.  During initial consultation patient was describing having chest pain while she was lifting objects, located on the right anterior precordium, 3 out of 10, nonradiating, lasting for couple hours, self-limited.  Discomfort was nonexertional.  Patient was advised to have an echocardiogram to evaluate for structural heart disease and exercise treadmill stress test.  Since last office visit patient has not had any reoccurrence of chest pain.  No hospitalizations or urgent care visits for cardiovascular symptoms.  No longer experiencing effort related dyspnea or palpitations.  Patient was recommended to have an extended Holter monitor to evaluate for which she described as irregular heartbeat.  However patient chose not to proceed with the extended Holter monitor as her symptoms are resolved.  Patient continues to consume alcohol either every day or every other day no more than 2 cans of beer per sitting.  She also has sleep apnea and chooses not to be on CPAP.   FUNCTIONAL STATUS: No structured exercise program or daily routine.   ALLERGIES: Allergies  Allergen Reactions   Amoxicillin  Other (See Comments)    Severe yeast infection Has patient had a PCN reaction causing immediate rash, facial/tongue/throat swelling, SOB or lightheadedness with hypotension: no Has patient had a PCN reaction causing severe rash involving mucus membranes or skin necrosis: No Has patient had a PCN reaction that required hospitalization: No Has patient had a PCN reaction occurring within the last 10 years: Yes If all of the above answers are "NO", then may proceed with Cephalosporin use.     MEDICATION LIST PRIOR TO VISIT: Current Meds  Medication Sig   hydrocortisone 2.5 % cream Apply 1 application topically 2 (two) times daily.   naproxen (NAPROSYN) 500 MG tablet TAKE 1 TABLET (500 MG TOTAL) BY MOUTH 2 (TWO) TIMES DAILY BETWEEN MEALS AS NEEDED.   Vitamin D, Ergocalciferol, (DRISDOL) 1.25 MG (50000 UNIT) CAPS capsule Take 50,000 Units by mouth once a week.     PAST MEDICAL HISTORY: Past Medical History:  Diagnosis Date   Anxiety attack 01-24-11   previoulsy and only once   GERD (gastroesophageal reflux disease) 01-24-11   reflux with hiatal hernia-no regular meds   Hiatal hernia 01-24-11   hx. of this recent dx./no nerve issues   Leg pain    Obesity    PONV (postoperative nausea and vomiting) 01-24-11   pt. has PONV with anesthesia occ.   Sleep apnea 01-24-11   no cpap used   Varicose vein of leg    BILATERAL    PAST SURGICAL HISTORY: Past Surgical History:  Procedure Laterality Date   CHOLECYSTECTOMY  01-24-11   '96-Laparoscopic   Cotton Osteotomy w/  Bone Graft Right 06/16/2015   ENDOMETRIAL ABLATION W/ NOVASURE     FOOT SURGERY  01-24-11   bilateral   GASTRIC ROUX-EN-Y  01/28/2011   Procedure: LAPAROSCOPIC ROUX-EN-Y GASTRIC;  Surgeon: Pedro Earls, MD;  Location: [facility];  Service: General;  Laterality: N/A;  With upper endoscopy   ORIF METATARSAL FRACTURE Right 07/14/2015   TUBAL LIGATION     UMBILICAL HERNIA REPAIR N/A 12/23/2012   Procedure: LAPAROSCOPY  REPAIR   UMBILICAL HERNIA, CLOSURE OF INTERNAL HERNIA DEFECT;  Surgeon: Pedro Earls, MD;  Location: [facility];  Service: General;  Laterality: N/A;    FAMILY HISTORY: The patient family history includes Diabetes in her brother and mother; Heart attack in her father; Heart disease in her mother; Hyperlipidemia in her father.  SOCIAL HISTORY:  The patient  reports that she has never smoked. She has never used smokeless tobacco. She reports current alcohol use of about 1.0 - 2.0 standard drink per week. She reports that she does not use drugs.  REVIEW OF SYSTEMS: Review of Systems  Constitutional: Negative for chills and fever.  HENT:  Negative for hoarse voice and nosebleeds.   Eyes:  Negative for discharge, double vision and pain.  Cardiovascular:  Negative for chest pain, claudication, dyspnea on exertion, leg swelling, near-syncope, orthopnea, palpitations, paroxysmal nocturnal dyspnea and syncope.  Respiratory:  Positive for snoring. Negative for hemoptysis and shortness of breath.   Musculoskeletal:  Negative for muscle cramps and myalgias.  Gastrointestinal:  Negative for abdominal pain, constipation, diarrhea, hematemesis, hematochezia, melena, nausea and vomiting.  Neurological:  Negative for dizziness and light-headedness.   PHYSICAL EXAM: Vitals with BMI 02/16/2021 01/19/2021 10/27/2020  Height 5\' 4"  5\' 4"  -  Weight 265 lbs 273 lbs -  BMI 123456 [id number] -  Systolic [id number] -  Diastolic 78 71 -  Pulse 75 63 119    CONSTITUTIONAL: Well-developed and well-nourished. No acute distress.  SKIN: Skin is warm and dry. No rash noted. No cyanosis. No pallor. No jaundice HEAD: Normocephalic and atraumatic.  EYES: No scleral icterus MOUTH/THROAT: Moist oral membranes.  NECK: No JVD present. No thyromegaly noted. No carotid bruits  LYMPHATIC: No visible cervical adenopathy.  CHEST Normal respiratory effort. No intercostal retractions  LUNGS: Clear to auscultation bilaterally.  No stridor. No  wheezes. No rales.  CARDIOVASCULAR: Regular rate and rhythm, positive Q000111Q, soft holosystolic murmur heard at the apex, no rubs or gallops appreciated. ABDOMINAL: Obese, soft, nontender, nondistended, positive bowel sounds in all 4 quadrants, no apparent ascites.  EXTREMITIES: No peripheral edema, warm to touch, palpable DP and PT pulses HEMATOLOGIC: No significant bruising NEUROLOGIC: Oriented to person, place, and time. Nonfocal. Normal muscle tone.  PSYCHIATRIC: Normal mood and affect. Normal behavior. Cooperative  CARDIAC DATABASE: EKG: 01/19/2021: Normal sinus rhythm, 67 bpm, normal axis, occasional PVCs, LAE, nonspecific T wave abnormality.    Echocardiogram: 04/27/2017: 1 LVEF 63%, mild LVH, severely dilated left atrium, mild to moderate MR, mild TR, RVSP 30 mmHg.  01/29/2021: Left ventricle cavity is normal in size. Mild concentric hypertrophy of the left ventricle. Normal global wall motion. Normal LV systolic function with EF 63%. Normal diastolic filling pattern. Left atrial cavity is mildly dilated. Mild (Grade I) mitral regurgitation. Mild tricuspid regurgitation. Estimated pulmonary artery systolic pressure 25 mmHg.  Stress Testing: Exercise treadmill stress test 01/29/2021: Exercise treadmill stress test performed using Bruce protocol.  Patient reached 4.6 METS, and 110% of age predicted maximum heart rate.  Exercise capacity was low.  No  chest pain reported, dyspnea reported. Normal heart rate response. Resting hypertension with hypertensive response. Peak BP 220/96 mmHg. Stress EKG revealed no ischemic changes. Recommend clinical correlation due to hypertensive response and very low exercise capacity.  Heart Catheterization: None  LABORATORY DATA: CBC Latest Ref Rng & Units 01/19/2021 04/26/2019 06/09/2017  WBC 3.8 - 10.8 Thousand/uL - 4.4 5.2  Hemoglobin 11.1 - 15.9 g/dL 27.0 62.3 76.2  Hematocrit 34.0 - 46.6 % 37.2 42.0 37.6  Platelets 140 - 400 Thousand/uL - 257  276    CMP Latest Ref Rng & Units 01/19/2021 04/26/2019 06/09/2017  Glucose 70 - 99 mg/dL 78 84 74  BUN 6 - 24 mg/dL 12 16 13   Creatinine 0.57 - 1.00 mg/dL 8.31 5.17  Sodium 134 - 144 mmol/L 144 143 144  Potassium 3.5 - 5.2 mmol/L 4.5 4.4 4.7  Chloride 96 - 106 mmol/L 102 105 111(H)  CO2 20 - 29 mmol/L 24 30 26   Calcium 8.7 - 10.2 mg/dL 9.2 9.4 8.8  Total Protein 6.1 - 8.1 g/dL - 6.7 6.3  Total Bilirubin 0.2 - 1.2 mg/dL - 0.5 0.4  Alkaline Phos 33 - 115 U/L - - -  AST 10 - 35 U/L - 16 17  ALT 6 - 29 U/L - 14 15    Lipid Panel     Component Value Date/Time   CHOL 185 04/26/2019 0932   TRIG 76 04/26/2019 0932   HDL 89 04/26/2019 0932   CHOLHDL 2.1 04/26/2019 0932   VLDL 11 08/06/2012 0930   LDLCALC 80 04/26/2019 0932    No components found for: NTPROBNP No results for input(s): PROBNP in the last 8760 hours. Recent Labs    01/19/21 1017  TSH 0.775    BMP Recent Labs    01/19/21 1017  NA 144  K 4.5  CL 102  CO2 24  GLUCOSE 78  BUN 12  CREATININE 0.86  CALCIUM 9.2    HEMOGLOBIN A1C Lab Results  Component Value Date   HGBA1C 6.4 (H) 10/24/2010   MPG 137 (H) 10/24/2010    IMPRESSION:    ICD-10-CM   1. Precordial pain  R07.2     2. Encounter to discuss test results  Z71.2     3. Irregular heartbeat  I49.9     4. Alcohol abuse  F10.10     5. Hx of gastric bypass  Z98.84     6. Class 3 severe obesity due to excess calories without serious comorbidity with body mass index (BMI) of 45.0 to 49.9 in adult [facility])  E66.01    Z68.42        RECOMMENDATIONS: KASEN SAKO is a 55 y.o. African-American female whose past medical history and cardiac risk factors include: Obesity due to excess calories, history of sleep apnea not on CPAP, alcohol use.  Patient's precordial discomfort appear to be noncardiac however given her risk factors she did undergo an echocardiogram and stress test.  Patient was noted to have preserved LVEF and no significant valvular  heart disease.  Stress test noted poor functional capacity for age, hypertensive at rest and with exercise, but stress ECG was negative for ischemia.  She has not had any reoccurrence of chest pain since her index event.  At this time the shared decision was to continue with improving her modifiable cardiovascular risk factors such as weight loss, increasing physical activity as tolerated with a goal of 30 minutes a day 5 days a week, considering restarting use of CPAP,  and reducing intake of alcohol.  In the past patient has noted that providers have mentioned that she has an irregular heartbeat.  I suspect that this is most likely secondary to her underlying PACs and PVCs.  However, echocardiogram does note a dilated left atrium and therefore patient is educated that she is at risk of developing atrial fibrillation in the future given her underlying obesity, untreated sleep apnea, alcohol use.  Patient is educated on improving her modifiable risk factors.  Patient appears to be motivated with regards to lifestyle changes.  Her exercise treadmill stress test noted hypertensive response at rest and with exercise.  Patient is made aware of this finding.  She does not check her blood pressures at home.  At home and her office blood pressures are currently well controlled.  I have asked her to keep a log of her blood pressures and to review it with her PCP at the next office visit to see if pharmacological therapy is warranted.  In the meantime she can start recommending a low-salt diet and increasing physical activity as discussed above.  If she is noticing blood pressures consistently greater than 140 mmHg at home she is asked to call either her PCP or our office for further direction.  She verbalizes understanding.  Patient would like to hold off on the extended Holter monitor for now unless if she has recurrence of symptoms.  FINAL MEDICATION LIST END OF ENCOUNTER: No orders of the defined types were  placed in this encounter.   There are no discontinued medications.    Current Outpatient Medications:    hydrocortisone 2.5 % cream, Apply 1 application topically 2 (two) times daily., Disp: , Rfl:    naproxen (NAPROSYN) 500 MG tablet, TAKE 1 TABLET (500 MG TOTAL) BY MOUTH 2 (TWO) TIMES DAILY BETWEEN MEALS AS NEEDED., Disp: 60 tablet, Rfl: 3   Vitamin D, Ergocalciferol, (DRISDOL) 1.25 MG (50000 UNIT) CAPS capsule, Take 50,000 Units by mouth once a week., Disp: , Rfl:   No orders of the defined types were placed in this encounter.   There are no Patient Instructions on file for this visit.   --Continue cardiac medications as reconciled in final medication list. --Return in about 1 year (around 02/16/2022) for Follow up. Or sooner if needed. --Continue follow-up with your primary care physician regarding the management of your other chronic comorbid conditions.  Patient's questions and concerns were addressed to her satisfaction. She voices understanding of the instructions provided during this encounter.   This note was created using a voice recognition software as a result there may be grammatical errors inadvertently enclosed that do not reflect the nature of this encounter. Every attempt is made to correct such errors.  Rex Kras, [place], [facility]  Pager: [phone] Office: [phone]

## 2021-05-09 ENCOUNTER — Ambulatory Visit (INDEPENDENT_AMBULATORY_CARE_PROVIDER_SITE_OTHER): Payer: Self-pay | Admitting: Podiatry

## 2021-05-09 DIAGNOSIS — Z91199 Patient's noncompliance with other medical treatment and regimen due to unspecified reason: Secondary | ICD-10-CM

## 2021-05-16 NOTE — Progress Notes (Signed)
   Complete physical exam  Patient: Emma Gonzales   DOB: 12/29/1998   56 y.o. Female  MRN: [id number]  Subjective:    No chief complaint on file.   Emma Gonzales is a 56 y.o. female who presents today for a complete physical exam. She reports consuming a {diet types:17450} diet. {types:19826} She generally feels {DESC; WELL/FAIRLY WELL/POORLY:18703}. She reports sleeping {DESC; WELL/FAIRLY WELL/POORLY:18703}. She {does/does not:200015} have additional problems to discuss today.    Most recent fall risk assessment:    09/05/2021   10:42 AM  Fall Risk   Falls in the past year? 0  Number falls in past yr: 0  Injury with Fall? 0  Risk for fall due to : No Fall Risks  Follow up Falls evaluation completed     Most recent depression screenings:    09/05/2021   10:42 AM 07/27/2020   10:46 AM  PHQ 2/9 Scores  PHQ - 2 Score 0 0  PHQ- 9 Score 5     {VISON DENTAL STD PSA (Optional):27386}  {History (Optional):23778}  Patient Care Team: Jessup, Joy, NP as PCP - General (Nurse Practitioner)   Outpatient Medications Prior to Visit  Medication Sig   fluticasone (FLONASE) 50 MCG/ACT nasal spray Place 2 sprays into both nostrils in the morning and at bedtime. After 7 days, reduce to once daily.   norgestimate-ethinyl estradiol (SPRINTEC 28) 0.25-35 MG-MCG tablet Take 1 tablet by mouth daily.   Nystatin POWD Apply liberally to affected area 2 times per day   spironolactone (ALDACTONE) 100 MG tablet Take 1 tablet (100 mg total) by mouth daily.   No facility-administered medications prior to visit.    ROS        Objective:     There were no vitals taken for this visit. {Vitals History (Optional):23777}  Physical Exam   No results found for any visits on 10/11/21. {Show previous labs (optional):23779}    Assessment & Plan:    Routine Health Maintenance and Physical Exam  Immunization History  Administered Date(s) Administered   DTaP 03/14/1999, 05/10/1999,  07/19/1999, 04/03/2000, 10/18/2003   Hepatitis A 08/14/2007, 08/19/2008   Hepatitis B 12/30/1998, 02/06/1999, 07/19/1999   HiB (PRP-OMP) 03/14/1999, 05/10/1999, 07/19/1999, 04/03/2000   IPV 03/14/1999, 05/10/1999, 01/07/2000, 10/18/2003   Influenza,inj,Quad PF,6+ Mos 11/19/2013   Influenza-Unspecified 02/19/2012   MMR 01/06/2001, 10/18/2003   Meningococcal Polysaccharide 08/19/2011   Pneumococcal Conjugate-13 04/03/2000   Pneumococcal-Unspecified 07/19/1999, 10/02/1999   Tdap 08/19/2011   Varicella 01/07/2000, 08/14/2007    Health Maintenance  Topic Date Due   HIV Screening  Never done   Hepatitis C Screening  Never done   INFLUENZA VACCINE  10/09/2021   PAP-Cervical Cytology Screening  10/11/2021 (Originally 12/29/2019)   PAP SMEAR-Modifier  10/11/2021 (Originally 12/29/2019)   TETANUS/TDAP  10/11/2021 (Originally 08/18/2021)   HPV VACCINES  Discontinued   COVID-19 Vaccine  Discontinued    Discussed health benefits of physical activity, and encouraged her to engage in regular exercise appropriate for her age and condition.  Problem List Items Addressed This Visit   None Visit Diagnoses     Annual physical exam    -  Primary   Cervical cancer screening       Need for Tdap vaccination          No follow-ups on file.     Joy Jessup, NP   

## 2021-05-26 ENCOUNTER — Other Ambulatory Visit (INDEPENDENT_AMBULATORY_CARE_PROVIDER_SITE_OTHER): Payer: Self-pay | Admitting: Orthopaedic Surgery

## 2021-10-09 ENCOUNTER — Other Ambulatory Visit (INDEPENDENT_AMBULATORY_CARE_PROVIDER_SITE_OTHER): Payer: Self-pay | Admitting: Physician Assistant

## 2022-02-13 ENCOUNTER — Other Ambulatory Visit (INDEPENDENT_AMBULATORY_CARE_PROVIDER_SITE_OTHER): Payer: Self-pay | Admitting: Physician Assistant

## 2022-02-15 ENCOUNTER — Ambulatory Visit: Payer: 59 | Admitting: Cardiology

## 2022-06-12 ENCOUNTER — Telehealth: Payer: Self-pay | Admitting: Orthopaedic Surgery

## 2022-06-12 NOTE — Telephone Encounter (Signed)
Received vm from patient. She stated she is still having chronic knee pain and has not been back in due to financial reasons. She is in need of a work note stating due to her chronic knee pain, she is unable to walk stairs. Patients callback [phone]

## 2022-06-12 NOTE — Telephone Encounter (Signed)
Lvm advising  

## 2022-06-12 NOTE — Telephone Encounter (Signed)
Note done

## 2022-06-19 ENCOUNTER — Telehealth: Payer: Self-pay | Admitting: Orthopaedic Surgery

## 2022-06-19 NOTE — Telephone Encounter (Signed)
Patient job need specific job note, patient is working in [employer] and has to go up and down 3 flights of stairs and she is unable to do all 3 but 1 is fine, also she will be needing several breaks throughout her shifts to rest her left knee.

## 2022-06-20 NOTE — Telephone Encounter (Signed)
Lvm advising pt.

## 2022-06-20 NOTE — Telephone Encounter (Signed)
Note completed 

## 2022-07-29 ENCOUNTER — Other Ambulatory Visit: Payer: Self-pay

## 2022-07-29 MED ORDER — NAPROXEN 500 MG PO TABS: 500.0000 mg | ORAL_TABLET | Freq: Two times a day (BID) | ORAL | 2 refills | Status: DC | PRN

## 2022-08-25 ENCOUNTER — Other Ambulatory Visit: Payer: Self-pay

## 2022-08-25 ENCOUNTER — Emergency Department (HOSPITAL_COMMUNITY)
Admission: EM | Admit: 2022-08-25 | Discharge: 2022-08-25 | Disposition: A | Discharge status: Home / Self Care | Payer: 59 | Attending: Emergency Medicine | Admitting: Emergency Medicine

## 2022-08-25 ENCOUNTER — Emergency Department (HOSPITAL_COMMUNITY): Payer: 59

## 2022-08-25 DIAGNOSIS — M25512 Pain in left shoulder: Secondary | ICD-10-CM

## 2022-08-25 MED ORDER — NAPROXEN 375 MG PO TABS: 375.0000 mg | ORAL_TABLET | Freq: Two times a day (BID) | ORAL | 0 refills | Status: AC

## 2022-08-25 MED ORDER — CYCLOBENZAPRINE HCL 5 MG PO TABS: 10.0000 mg | ORAL_TABLET | Freq: Two times a day (BID) | ORAL | 0 refills | Status: DC | PRN

## 2022-08-25 NOTE — ED Triage Notes (Signed)
Pt arrived via POV. C/o L shoulder pain for several weeks. Pt has job w/repetitive movements moving boxes

## 2022-08-25 NOTE — Discharge Instructions (Addendum)
Please return to the ED with any new or worsening signs or symptoms Please begin taking naproxen twice daily as needed for pain and shoulder.  He may also take muscle relaxer twice daily as needed for pain and shoulder.  Please be aware muscle relaxers will cause you to become drowsy so do not drive or operate heavy machinery under the influence of this medication. You may also purchase Salonpas patches and apply to your left shoulder for pain control. Please follow-up with orthopedic doctor as soon as possible for further management Please remain in shoulder sling for comfort.  You may remove the shoulder sling throughout the day to avoid stiffness in the shoulder.

## 2022-08-25 NOTE — ED Provider Notes (Signed)
[place] EMERGENCY DEPARTMENT AT [facility] [place] Provider Note   CSN: [id number] Arrival date & time: 08/25/22  1252     History  Chief Complaint  Patient presents with   Shoulder Pain    Emma Gonzales is a 57 y.o. female with medical history of obesity, leg pain, varicose veins of legs.  The patient presents to ED for evaluation of left shoulder pain.  Patient reports that for the last 7 days she has increased left shoulder pain which she attributes to her job.  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  She states that she has a history of right shoulder pain that presented in a very similar fashion.  She states the pain waxes and wanes, pain primarily located in the back of her shoulder.  She denies any trauma to account for this pain.  She denies any medications prior to arrival.  She denies any chest pain, shortness of breath, nausea or vomiting.   Shoulder Pain      Home Medications Prior to Admission medications   Medication Sig Start Date End Date Taking? Authorizing Provider  cyclobenzaprine (FLEXERIL) 5 MG tablet Take 2 tablets (10 mg total) by mouth 2 (two) times daily as needed for muscle spasms. 08/25/22  Yes Al Decant, PA-C  naproxen (NAPROSYN) 375 MG tablet Take 1 tablet (375 mg total) by mouth 2 (two) times daily. 08/25/22  Yes Al Decant, PA-C  hydrocortisone 2.5 % cream Apply 1 application topically 2 (two) times daily. 09/09/20   [provider]  Vitamin D, Ergocalciferol, (DRISDOL) 1.25 MG (50000 UNIT) CAPS capsule Take 50,000 Units by mouth once a week. 09/18/20   [provider]      Allergies    Amoxicillin    Review of Systems   Review of Systems  Musculoskeletal:  Positive for arthralgias.  All other systems reviewed and are negative.   Physical Exam Updated Vital Signs BP (!) 176/90 (BP Location: Left Arm)   Pulse 70   Temp 98.1 F (36.7 C) (Oral)    Resp 16   Ht 5\' 4"  (1.626 m)   Wt 108.9 kg   SpO2 100%   BMI 41.20 kg/m  Physical Exam Vitals and nursing note reviewed.  Constitutional:      General: She is not in acute distress.    Appearance: Normal appearance. She is not ill-appearing, toxic-appearing or diaphoretic.  HENT:     Head: Normocephalic and atraumatic.     Nose: Nose normal.     Mouth/Throat:     Mouth: Mucous membranes are moist.     Pharynx: Oropharynx is clear.  Eyes:     Extraocular Movements: Extraocular movements intact.     Conjunctiva/sclera: Conjunctivae normal.     Pupils: Pupils are equal, round, and reactive to light.  Cardiovascular:     Rate and Rhythm: Normal rate and regular rhythm.  Pulmonary:     Effort: Pulmonary effort is normal.     Breath sounds: Normal breath sounds.  Abdominal:     General: Abdomen is flat. Bowel sounds are normal.     Palpations: Abdomen is soft.     Tenderness: There is no abdominal tenderness.  Musculoskeletal:     Right shoulder: Normal.     Left shoulder: Tenderness present. No swelling, deformity or effusion. Normal range of motion.     Cervical back: Normal range of motion and neck supple.  No tenderness.     Comments: Full active and passive range of motion of left shoulder.  Patient does have tenderness to her posterior shoulder but no overlying skin change or deformity.  5 out of 5 grip strength patient left hand.  Skin:    General: Skin is warm and dry.     Capillary Refill: Capillary refill takes less than 2 seconds.  Neurological:     Mental Status: She is alert and oriented to person, place, and time.     ED Results / Procedures / Treatments   Labs (all labs ordered are listed, but only abnormal results are displayed) Labs Reviewed - No data to display  EKG None  Radiology DG Shoulder Left  Result Date: 08/25/2022 CLINICAL DATA:  57 year old female with history of left shoulder pain for the past several weeks. EXAM: LEFT SHOULDER - 2+ VIEW  COMPARISON:  No priors. FINDINGS: Three views of the left shoulder demonstrate no acute displaced fracture, subluxation or dislocation. IMPRESSION: 1. No acute radiographic abnormality of the left shoulder. Electronically Signed   By: Trudie Reed M.D.   On: 08/25/2022 13:47    Procedures Procedures   Medications Ordered in ED Medications - No data to display  ED Course/ Medical Decision Making/ A&P  Medical Decision Making Amount and/or Complexity of Data Reviewed Radiology: ordered.   57 year old female presents ED for evaluation.  Please see HPI for further details.  On examination the patient left shoulder has no obvious deformity.  Patient has 5 out of 5 grip strength of her left hand.  She has full active and passive range of motion of the left shoulder.  There is tenderness to posterior shoulder but no overlying skin change.  X-ray imaging of patient left shoulder shows no obvious fracture, pathology to account for pain.  Patient was placed in shoulder sling, referred to orthopedics.  She reports that she is currently already seeing an orthopedic doctor so she will follow-up with them.  She will be sent home with muscle relaxers and naproxen.  She was advised to rest at home but she has deferred on this, I offered her a work note.  She states that she feels fine enough to work.  She will return to the ED with any new or worsening signs or symptoms.  She had all her questions answered to her satisfaction.  Patient stable to discharge home.   Final Clinical Impression(s) / ED Diagnoses Final diagnoses:  Acute pain of left shoulder    Rx / DC Orders ED Discharge Orders          Ordered    naproxen (NAPROSYN) 375 MG tablet  2 times daily        08/25/22 1426    cyclobenzaprine (FLEXERIL) 5 MG tablet  2 times daily PRN        08/25/22 1426              Al Decant, PA-C 08/25/22 1428    Alvira Monday, MD 08/26/22 1139

## 2022-08-29 ENCOUNTER — Telehealth: Payer: Self-pay

## 2022-08-29 NOTE — Telephone Encounter (Signed)
Transition Care Management Unsuccessful Follow-up Telephone Call  Date of discharge and from where:  ED only 6/16 left shoulder pain  Attempts:  1st Attempt  Reason for unsuccessful TCM follow-up call:  Left voice message

## 2022-08-30 NOTE — Telephone Encounter (Signed)
Transition Care Management Follow-up Telephone Call Date of discharge and from where: [facility] 08/25/22 How have you been since you were released from the hospital? Still hurts a little doing better, taking medications Any questions or concerns? No  Items Reviewed: Did the pt receive and understand the discharge instructions provided? Yes  Medications obtained and verified? Yes  Other? No  Any new allergies since your discharge? No  Dietary orders reviewed? No Do you have support at home? Yes   Home Care and Equipment/Supplies: Were home health services ordered? not applicable If so, what is the name of the agency? N/a  Has the agency set up a time to come to the patient's home? not applicable Were any new equipment or medical supplies ordered?  No What is the name of the medical supply agency? N/a Were you able to get the supplies/equipment? not applicable Do you have any questions related to the use of the equipment or supplies? No  Functional Questionnaire: (I = Independent and D = Dependent) ADLs: I  Bathing/Dressing- I  Meal Prep- I  Eating- I  Maintaining continence- I  Transferring/Ambulation- I  Managing Meds- I  Follow up appointments reviewed:  PCP Hospital f/u appt confirmed? No  Specialist Hospital f/u appt confirmed?  She is to see ortho for follow up. She does not have inusrance at this time and is paying out of pocket. She will go to ortho if this does not resolve on its own by end of course of medications.   Are transportation arrangements needed? No  If their condition worsens, is the pt aware to call PCP or go to the Emergency Dept.? Yes Was the patient provided with contact information for the PCP's office or ED? Yes Was to pt encouraged to call back with questions or concerns? Yes

## 2022-09-02 NOTE — Telephone Encounter (Signed)
Transition Care Management Unsuccessful Follow-up Telephone Call  Date of discharge and from where:  [facility] ED only   Attempts:  2nd Attempt  Reason for unsuccessful TCM follow-up call:  Left voice message

## 2022-10-10 ENCOUNTER — Ambulatory Visit: Payer: 59 | Admitting: Physician Assistant

## 2022-10-30 ENCOUNTER — Encounter: Payer: Self-pay | Admitting: Orthopaedic Surgery

## 2022-10-30 ENCOUNTER — Ambulatory Visit: Payer: 59 | Admitting: Orthopaedic Surgery

## 2022-10-30 VITALS — Wt 283.0 lb

## 2022-10-30 DIAGNOSIS — M1711 Unilateral primary osteoarthritis, right knee: Secondary | ICD-10-CM

## 2022-10-30 DIAGNOSIS — M25561 Pain in right knee: Secondary | ICD-10-CM

## 2022-10-30 DIAGNOSIS — G8929 Other chronic pain: Secondary | ICD-10-CM

## 2022-10-30 DIAGNOSIS — M25562 Pain in left knee: Secondary | ICD-10-CM

## 2022-10-30 DIAGNOSIS — M1712 Unilateral primary osteoarthritis, left knee: Secondary | ICD-10-CM | POA: Diagnosis not present

## 2022-10-30 NOTE — Progress Notes (Signed)
The patient is someone I have not seen in a while.  She has significant arthritis in both of her knees and at this point it has become debilitating.  She has had steroid injections in the past and those no longer help.  She does work almost all week long for long shifts.  She needs some type of note reflecting that she can only work 8-hour days/shifts and only Monday through Friday.  She is not diabetic.  Her BMI today is 48.58.  Both knees have significant varus malalignment on exam even when she sits or stands.  There is patellofemoral crepitation and global tenderness.  We talked about her being on a weight loss journey and trying to lose weight.  The last time we saw her BMI was 46 so she has put on some weight.  She would benefit from a referral to a medical weight loss clinic and she would like to have that referral.  Of note she does not have a very large soft tissue envelope around either knee.  I do feel that she would benefit at some point from knee replacement surgery but we need to document a weight loss journey.  We will see her back in 3 months for repeat weight and BMI calculation.  At that visit we do need new x-rays with a standing AP and lateral both knees.

## 2022-10-31 ENCOUNTER — Other Ambulatory Visit: Payer: Self-pay

## 2022-12-18 ENCOUNTER — Telehealth: Payer: Self-pay | Admitting: Internal Medicine

## 2022-12-18 NOTE — Telephone Encounter (Signed)
Called patient to get more of explantation, she said she has hungry issues, hard to explain, burning at top of stomach, and she hiccups a lot and this has been going on for awhile.. I scheduled her for Monday.

## 2022-12-18 NOTE — Telephone Encounter (Signed)
[facility] [phone]  Em[facility] called to say she is having stomach issues and wants a referral to Dr Kenna Gilbert office.

## 2022-12-20 NOTE — Telephone Encounter (Signed)
Patient is scheduled to come in on Monday.

## 2022-12-23 ENCOUNTER — Ambulatory Visit: Payer: 59 | Admitting: Internal Medicine

## 2022-12-23 ENCOUNTER — Encounter: Payer: Self-pay | Admitting: Internal Medicine

## 2022-12-23 VITALS — BP 102/80 | HR 97 | Temp 99.4°F | Ht 64.0 in | Wt 278.0 lb

## 2022-12-23 DIAGNOSIS — R109 Unspecified abdominal pain: Secondary | ICD-10-CM | POA: Diagnosis not present

## 2022-12-23 DIAGNOSIS — R1013 Epigastric pain: Secondary | ICD-10-CM | POA: Diagnosis not present

## 2022-12-23 MED ORDER — PANTOPRAZOLE SODIUM 40 MG PO TBEC: 40.0000 mg | DELAYED_RELEASE_TABLET | Freq: Every day | ORAL | 1 refills | Status: DC

## 2022-12-23 NOTE — Progress Notes (Signed)
Patient Care Team: [name], MD as PCP - General (Internal Medicine) Himmelrich, Loree Fee, RD (Inactive) as Dietitian  Visit Date: 12/23/22  Subjective:    Patient ID: Emma Gonzales , Female   DOB: 1965-06-17, 57 y.o.    MRN: [id number]   57 y.o. Female presents today for epigastric pain for the past 2-3 weeks. Denies change in stress level. She has been having frequent hiccups that happen one at a time and feel like acid reflux. Denies black tarry stools, bright red blood in stool, constipation, diarrhea, fever, chills. Taking Pepto Bismol pills with some relief. She is eating yogurt for breakfast, lean cuisine meals for lunch, and a balanced dinner.  She recently changed jobs to something that is less physically intense.Is happy thus far in new job. Previous job had lots of walking and standing on feet for hours at a time. It was exhausting for her and she was quite fatigued. Thus far new job seems OK.  Hx of sleep apnea, anxiety and depression. Remote hx of palpitations. Had Echo at [facility] showing mild LVH and mild tricuspid and mild mitral regurgitation.   Past Medical History:  Diagnosis Date   Anxiety attack 01-24-11   previoulsy and only once   GERD (gastroesophageal reflux disease) 01-24-11   reflux with hiatal hernia-no regular meds   Hiatal hernia 01-24-11   hx. of this recent dx./no nerve issues   Leg pain    Obesity    PONV (postoperative nausea and vomiting) 01-24-11   pt. has PONV with anesthesia occ.   Sleep apnea 01-24-11   no cpap used   Varicose vein of leg    BILATERAL     Family History  Problem Relation Age of Onset   Diabetes Mother    Heart disease Mother        Not sure what type of heart disease   Hyperlipidemia Father    Heart attack Father    Diabetes Brother     Social Hx: Divorced. Has steady female partner. Nonsmoker.     Review of Systems  Constitutional:  Negative for fever and malaise/fatigue.  HENT:   Negative for congestion.   Eyes:  Negative for blurred vision.  Respiratory:  Negative for cough and shortness of breath.   Cardiovascular:  Negative for chest pain, palpitations and leg swelling.  Gastrointestinal:  Positive for abdominal pain (Epigastric). Negative for blood in stool, constipation, diarrhea, heartburn, melena and vomiting.       (+) Acid reflux  Musculoskeletal:  Negative for back pain.  Skin:  Negative for rash.  Neurological:  Negative for loss of consciousness and headaches.        Objective:   Vitals: BP 102/80   Pulse 97   Temp 99.4 F (37.4 C)   Ht 5\' 4"  (1.626 m)   Wt 278 lb (126.1 kg)   SpO2 96%   BMI 47.72 kg/m    Physical Exam Vitals and nursing note reviewed.  Constitutional:      General: She is not in acute distress.    Appearance: Normal appearance. She is not toxic-appearing.  HENT:     Head: Normocephalic and atraumatic.  Pulmonary:     Effort: Pulmonary effort is normal.  Abdominal:     Palpations: There is no hepatomegaly or splenomegaly.     Comments: Mild epigastric tenderness without rebound.  Skin:    General: Skin is warm and dry.  Neurological:  Mental Status: She is alert and oriented to person, place, and time. Mental status is at baseline.  Psychiatric:        Mood and Affect: Mood normal.        Behavior: Behavior normal.        Thought Content: Thought content normal.        Judgment: Judgment normal.      Results:   Studies obtained and personally reviewed by me:   Labs:       Component Value Date/Time   NA 144 01/19/2021 1017   K 4.5 01/19/2021 1017   CL 102 01/19/2021 1017   CO2 24 01/19/2021 1017   GLUCOSE 78 01/19/2021 1017   GLUCOSE 84 04/26/2019 0932   BUN 12 01/19/2021 1017   CREATININE 0.86 01/19/2021 1017   CREATININE 0.91 04/26/2019 0932   CALCIUM 9.2 01/19/2021 1017   PROT 6.7 04/26/2019 0932   ALBUMIN 3.7 11/22/2014 1640   AST 16 04/26/2019 0932   ALT 14 04/26/2019 0932   ALKPHOS  65 11/22/2014 1640   BILITOT 0.5 04/26/2019 0932   GFRNONAA 72 04/26/2019 0932   GFRAA 83 04/26/2019 0932     Lab Results  Component Value Date   WBC 4.4 04/26/2019   HGB 12.0 01/19/2021   HCT 37.2 01/19/2021   MCV 89.6 04/26/2019   PLT 257 04/26/2019    Lab Results  Component Value Date   CHOL 185 04/26/2019   HDL 89 04/26/2019   LDLCALC 80 04/26/2019   TRIG 76 04/26/2019   CHOLHDL 2.1 04/26/2019    Lab Results  Component Value Date   HGBA1C 6.4 (H) 10/24/2010     Lab Results  Component Value Date   TSH 0.775 01/19/2021      Assessment & Plan:   Acid reflux / epigastric pain:  I think situational stress with previous job precipitated epigastric pain.  Has not been at new job very long but feels less stress. Have prescribed pantoprazole 40 mg daily. Ordered CBC with Diff/Plat.Advise to not eat greasy/fatty food or spicy foods for a couple of weeks. Hoping she will respond to PPI prescribed today. She will let me know if not doing better in 3-4 weeks or sooner if worse.    I,Alexander Ruley,acting as a Neurosurgeon for [name], MD.,have documented all relevant documentation on the behalf of [name], MD,as directed by  [name], MD while in the presence of [name], MD.   I, [name], MD, have reviewed all documentation for this visit. The documentation on 01/06/23 for the exam, diagnosis, procedures, and orders are all accurate and complete.

## 2022-12-24 LAB — CBC WITH DIFFERENTIAL/PLATELET
Absolute Monocytes: 676 {cells}/uL (ref 200–950)
Basophils Absolute: 20 {cells}/uL (ref 0–200)
Basophils Relative: 0.3 %
Eosinophils Absolute: 52 {cells}/uL (ref 15–500)
Eosinophils Relative: 0.8 %
HCT: 37 % (ref 35.0–45.0)
Hemoglobin: 11.5 g/dL — ABNORMAL LOW (ref 11.7–15.5)
Lymphs Abs: 1775 {cells}/uL (ref 850–3900)
MCH: 26.6 pg — ABNORMAL LOW (ref 27.0–33.0)
MCHC: 31.1 g/dL — ABNORMAL LOW (ref 32.0–36.0)
MCV: 85.6 fL (ref 80.0–100.0)
MPV: 9.9 fL (ref 7.5–12.5)
Monocytes Relative: 10.4 %
Neutro Abs: 3978 {cells}/uL (ref 1500–7800)
Neutrophils Relative %: 61.2 %
Platelets: 284 10*3/uL (ref 140–400)
RBC: 4.32 10*6/uL (ref 3.80–5.10)
RDW: 16.1 % — ABNORMAL HIGH (ref 11.0–15.0)
Total Lymphocyte: 27.3 %
WBC: 6.5 10*3/uL (ref 3.8–10.8)

## 2023-01-06 NOTE — Patient Instructions (Signed)
I think situational stress with previous job precipitated epigastric pain.  Already feels less stress with new job.  Have prescribed pantoprazole 40 mg daily.  Ordered CBC with differential and this proved to be within normal limits.  Advised not to eat greasy/fatty food or spicy foods for a couple of weeks.  Hope you will respond to PPI prescribed today.  Let me know if not doing better in 3 to 4 weeks or sooner if worse.  It was a pleasure to see you today.  Sorry you have not been feeling well.

## 2023-01-16 ENCOUNTER — Other Ambulatory Visit: Payer: Self-pay | Admitting: Internal Medicine

## 2023-02-03 ENCOUNTER — Other Ambulatory Visit (INDEPENDENT_AMBULATORY_CARE_PROVIDER_SITE_OTHER): Payer: 59

## 2023-02-03 ENCOUNTER — Encounter: Payer: Self-pay | Admitting: Orthopaedic Surgery

## 2023-02-03 ENCOUNTER — Ambulatory Visit (INDEPENDENT_AMBULATORY_CARE_PROVIDER_SITE_OTHER): Payer: 59 | Admitting: Orthopaedic Surgery

## 2023-02-03 VITALS — Ht 64.0 in | Wt 283.0 lb

## 2023-02-03 DIAGNOSIS — M25562 Pain in left knee: Secondary | ICD-10-CM

## 2023-02-03 DIAGNOSIS — M25561 Pain in right knee: Secondary | ICD-10-CM | POA: Diagnosis not present

## 2023-02-03 DIAGNOSIS — G8929 Other chronic pain: Secondary | ICD-10-CM

## 2023-02-03 NOTE — Progress Notes (Signed)
The patient is someone we have seen in the past.  She has known arthritis of both of her knees.  She does have a BMI well over 40.  Her BMI in the office today is 48.58 which is the same as it was with [place] in August.  She has had steroid injections in her knees in the past.  She does see a weight loss specialist it was supposed to be in December but has been pushed to January in another month.  She does report bilateral knee pain that is daily and is detrimentally affecting her mobility, her quality of life and activities of daily living.  She has had steroid injections in the last that long.  On exam both knees have valgus malalignment with significant lateral tenderness and patellofemoral crepitation.  Standing view of both knees today shows significant and severe tracking arthritis with valgus malalignment and bone-on-bone wear of the lateral compartment and patellofemoral joint.  She does have a larger thigh but not a large soft tissue envelope around her knees.  However we cannot still schedule any type of surgery until she demonstrates a more significant weight loss since she understands this as well.  We can see her back in 3 months for repeat weight and BMI calculation.

## 2023-02-11 ENCOUNTER — Ambulatory Visit (INDEPENDENT_AMBULATORY_CARE_PROVIDER_SITE_OTHER): Payer: 59 | Admitting: Internal Medicine

## 2023-02-11 ENCOUNTER — Encounter: Payer: Self-pay | Admitting: Internal Medicine

## 2023-02-11 VITALS — BP 100/80 | HR 73 | Ht 64.0 in | Wt 280.0 lb

## 2023-02-11 DIAGNOSIS — M79605 Pain in left leg: Secondary | ICD-10-CM

## 2023-02-11 DIAGNOSIS — M79604 Pain in right leg: Secondary | ICD-10-CM | POA: Diagnosis not present

## 2023-02-11 DIAGNOSIS — I8393 Asymptomatic varicose veins of bilateral lower extremities: Secondary | ICD-10-CM

## 2023-02-11 NOTE — Progress Notes (Signed)
Patient Care Team: [name], MD as PCP - General (Internal Medicine) Himmelrich, Loree Fee, RD (Inactive) as Dietitian  Visit Date: 02/11/23  Subjective:    Patient ID: Emma [facility] , Female   DOB: 11/04/1965, 57 y.o.    MRN: [id number]   57 y.o. Female presents today for tenderness in lower legs recently. History of superficial varicosities. Denies shortness of breath. Heartburn and stress have improved with new job.  Past Medical History:  Diagnosis Date   Anxiety attack 01-24-11   previoulsy and only once   GERD (gastroesophageal reflux disease) 01-24-11   reflux with hiatal hernia-no regular meds   Hiatal hernia 01-24-11   hx. of this recent dx./no nerve issues   Leg pain    Obesity    PONV (postoperative nausea and vomiting) 01-24-11   pt. has PONV with anesthesia occ.   Sleep apnea 01-24-11   no cpap used   Varicose vein of leg    BILATERAL     Family History  Problem Relation Age of Onset   Diabetes Mother    Heart disease Mother        Not sure what type of heart disease   Hyperlipidemia Father    Heart attack Father    Diabetes Brother     Social Hx: unchanged. New job working out well.     Review of Systems  Constitutional:  Negative for fever and malaise/fatigue.  HENT:  Negative for congestion.   Eyes:  Negative for blurred vision.  Respiratory:  Negative for cough and shortness of breath.   Cardiovascular:  Negative for chest pain, palpitations and leg swelling.  Gastrointestinal:  Negative for vomiting.  Musculoskeletal:  Negative for back pain.       (+) Tenderness lower legs  Skin:  Negative for rash.  Neurological:  Negative for loss of consciousness and headaches.        Objective:   Vitals: BP 100/80   Pulse 73   Ht 5\' 4"  (1.626 m)   Wt 280 lb (127 kg)   SpO2 99%   BMI 48.06 kg/m    Physical Exam Vitals and nursing note reviewed.  Constitutional:      General: She is not in acute distress.    Appearance: Normal  appearance. She is not toxic-appearing.  HENT:     Head: Normocephalic and atraumatic.  Cardiovascular:     Comments: Superficial varicosities bilateral lower extremities. Homens' sign negative bilaterally. Pulmonary:     Effort: Pulmonary effort is normal.  Musculoskeletal:     Comments: Tenderness to palpation of anterior and posterior lower legs.  Skin:    General: Skin is warm and dry.  Neurological:     Mental Status: She is alert and oriented to person, place, and time. Mental status is at baseline.  Psychiatric:        Mood and Affect: Mood normal.        Behavior: Behavior normal.        Thought Content: Thought content normal.        Judgment: Judgment normal.       Results:   Studies obtained and personally reviewed by me:   Labs:       Component Value Date/Time   NA 144 01/19/2021 1017   K 4.5 01/19/2021 1017   CL 102 01/19/2021 1017   CO2 24 01/19/2021 1017   GLUCOSE 78 01/19/2021 1017   GLUCOSE 84 04/26/2019 0932   BUN 12 01/19/2021 1017  CREATININE 0.86 01/19/2021 1017   CREATININE 0.91 04/26/2019 0932   CALCIUM 9.2 01/19/2021 1017   PROT 6.7 04/26/2019 0932   ALBUMIN 3.7 11/22/2014 1640   AST 16 04/26/2019 0932   ALT 14 04/26/2019 0932   ALKPHOS 65 11/22/2014 1640   BILITOT 0.5 04/26/2019 0932   GFRNONAA 72 04/26/2019 0932   GFRAA 83 04/26/2019 0932     Lab Results  Component Value Date   WBC 6.5 12/23/2022   HGB 11.5 (L) 12/23/2022   HCT 37.0 12/23/2022   MCV 85.6 12/23/2022   PLT 284 12/23/2022    Lab Results  Component Value Date   CHOL 185 04/26/2019   HDL 89 04/26/2019   LDLCALC 80 04/26/2019   TRIG 76 04/26/2019   CHOLHDL 2.1 04/26/2019    Lab Results  Component Value Date   HGBA1C 6.4 (H) 10/24/2010     Lab Results  Component Value Date   TSH 0.775 01/19/2021      Assessment & Plan:   Tenderness bilateral lower legs: likely due to irritation of superficial varicosities in both lower legs. Advised on using heat  therapy.  Vaccine counseling: she will go to the pharmacy for shingles vaccine.    I,Alexander Ruley,acting as a Neurosurgeon for [name], MD.,have documented all relevant documentation on the behalf of [name], MD,as directed by  [name], MD while in the presence of [name], MD.   I, [name], MD, have reviewed all documentation for this visit. The documentation on 02/24/23 for the exam, diagnosis, procedures, and orders are all accurate and complete.

## 2023-02-24 NOTE — Patient Instructions (Addendum)
Patient concerned about superficial varicosities of lower legs.  Patient reassured that these are unlikely to cause any harm but are unsightly and can be treated if they are bothersome to her from an aesthetic standpoint.  Emma Gonzales' sign is negative.  Do not think she has deep venous thrombosis.  She is due for Shingrix vaccine in the near future.  May take nonsteroidal anti-inflammatory medications such as Aleve or Advil over-the-counter if needed for muscle pain in the lower extremities but advised to take this sparingly.  May find moist heat helpful for leg pain.  Has been prescribed Naprosyn by a previous provider and may continue with that for leg pain.  Advise shingles vaccine in the near future.

## 2023-02-27 ENCOUNTER — Encounter: Payer: Self-pay | Admitting: Internal Medicine

## 2023-02-27 ENCOUNTER — Ambulatory Visit: Payer: 59 | Admitting: Internal Medicine

## 2023-02-27 VITALS — BP 110/80 | HR 92 | Temp 98.8°F | Ht 64.0 in | Wt 280.0 lb

## 2023-02-27 DIAGNOSIS — B349 Viral infection, unspecified: Secondary | ICD-10-CM

## 2023-02-27 DIAGNOSIS — R6883 Chills (without fever): Secondary | ICD-10-CM

## 2023-02-27 DIAGNOSIS — R1110 Vomiting, unspecified: Secondary | ICD-10-CM

## 2023-02-27 LAB — POCT INFLUENZA A/B
Influenza A, POC: NEGATIVE
Influenza B, POC: NEGATIVE

## 2023-02-27 LAB — POC COVID19 BINAXNOW: SARS Coronavirus 2 Ag: NEGATIVE

## 2023-02-27 MED ORDER — FLUCONAZOLE 150 MG PO TABS: 150.0000 mg | ORAL_TABLET | Freq: Once | ORAL | 0 refills | Status: AC

## 2023-02-27 MED ORDER — AZITHROMYCIN 250 MG PO TABS: ORAL_TABLET | ORAL | 0 refills | Status: AC

## 2023-02-27 MED ORDER — HYDROCODONE BIT-HOMATROP MBR 5-1.5 MG/5ML PO SOLN: 5.0000 mL | Freq: Three times a day (TID) | ORAL | 0 refills | Status: DC | PRN

## 2023-02-27 NOTE — Progress Notes (Signed)
Patient Care Team: [name], MD as PCP - General (Internal Medicine) Himmelrich, Loree Fee, RD (Inactive) as Dietitian  Visit Date: 02/27/23  Subjective:    Patient ID: Emma Gonzales , Female   DOB: 12-12-1965, 57 y.o.    MRN: [id number]   57 y.o. Female presents today for sick visit. Woke up at 2am this morning with shaking fever, chills, nausea, one episode of vomiting. Reports having similar symptoms following prior Covid-19 vaccine inoculations. UTD on flu vaccine. Felt improved this morning. Denies urinary symptoms, abdominal pain.  Past Medical History:  Diagnosis Date   Anxiety attack 01-24-11   previoulsy and only once   GERD (gastroesophageal reflux disease) 01-24-11   reflux with hiatal hernia-no regular meds   Hiatal hernia 01-24-11   hx. of this recent dx./no nerve issues   Leg pain    Obesity    PONV (postoperative nausea and vomiting) 01-24-11   pt. has PONV with anesthesia occ.   Sleep apnea 01-24-11   no cpap used   Varicose vein of leg    BILATERAL     Family History  Problem Relation Age of Onset   Diabetes Mother    Heart disease Mother        Not sure what type of heart disease   Hyperlipidemia Father    Heart attack Father    Diabetes Brother     Social History   Social History Narrative   Not on file      Review of Systems  Constitutional:  Positive for chills and fever. Negative for malaise/fatigue.  HENT:  Negative for congestion.   Eyes:  Negative for blurred vision.  Respiratory:  Negative for cough and shortness of breath.   Cardiovascular:  Negative for chest pain, palpitations and leg swelling.  Gastrointestinal:  Positive for nausea and vomiting. Negative for abdominal pain.  Genitourinary:  Negative for dysuria, frequency, hematuria and urgency.  Musculoskeletal:  Negative for back pain.  Skin:  Negative for rash.  Neurological:  Negative for loss of consciousness and headaches.        Objective:   Vitals: BP  110/80 (BP Location: Left Arm, Patient Position: Sitting)   Pulse 92   Temp 98.8 F (37.1 C) (Tympanic)   Ht 5\' 4"  (1.626 m)   Wt 280 lb (127 kg)   SpO2 99%   BMI 48.06 kg/m    Physical Exam Vitals and nursing note reviewed.  Constitutional:      General: She is not in acute distress.    Appearance: Normal appearance. She is not toxic-appearing.  HENT:     Head: Normocephalic and atraumatic.     Right Ear: Hearing, tympanic membrane, ear canal and external ear normal.     Left Ear: Hearing, tympanic membrane, ear canal and external ear normal.     Mouth/Throat:     Pharynx: Oropharynx is clear. No oropharyngeal exudate.  Pulmonary:     Effort: Pulmonary effort is normal. No respiratory distress.     Breath sounds: Normal breath sounds. No wheezing or rales.  Skin:    General: Skin is warm and dry.  Neurological:     Mental Status: She is alert and oriented to person, place, and time. Mental status is at baseline.  Psychiatric:        Mood and Affect: Mood normal.        Behavior: Behavior normal.        Thought Content: Thought content normal.  Judgment: Judgment normal.       Results:   Studies obtained and personally reviewed by me:   Labs:       Component Value Date/Time   NA 144 01/19/2021 1017   K 4.5 01/19/2021 1017   CL 102 01/19/2021 1017   CO2 24 01/19/2021 1017   GLUCOSE 78 01/19/2021 1017   GLUCOSE 84 04/26/2019 0932   BUN 12 01/19/2021 1017   CREATININE 0.86 01/19/2021 1017   CREATININE 0.91 04/26/2019 0932   CALCIUM 9.2 01/19/2021 1017   PROT 6.7 04/26/2019 0932   ALBUMIN 3.7 11/22/2014 1640   AST 16 04/26/2019 0932   ALT 14 04/26/2019 0932   ALKPHOS 65 11/22/2014 1640   BILITOT 0.5 04/26/2019 0932   GFRNONAA 72 04/26/2019 0932   GFRAA 83 04/26/2019 0932     Lab Results  Component Value Date   WBC 6.5 12/23/2022   HGB 11.5 (L) 12/23/2022   HCT 37.0 12/23/2022   MCV 85.6 12/23/2022   PLT 284 12/23/2022    Lab Results   Component Value Date   CHOL 185 04/26/2019   HDL 89 04/26/2019   LDLCALC 80 04/26/2019   TRIG 76 04/26/2019   CHOLHDL 2.1 04/26/2019    Lab Results  Component Value Date   HGBA1C 6.4 (H) 10/24/2010     Lab Results  Component Value Date   TSH 0.775 01/19/2021       Assessment & Plan:   Acute viral syndrome: flu, covid tests negative. Prescribed Z-Pak two tabs day 1 followed by one tab days 2-5, Diflucan 150 mg once if needed for Candida vaginitis, Hycodan 1 tsp every 8 hours as needed for cough. Get plenty of rest and stay well-hydrated. Contact us if symptoms worsen or fail to improve.    I,Emily Lagle,acting as a Neurosurgeon for [name], MD.,have documented all relevant documentation on the behalf of [name], MD,as directed by  [name], MD while in the presence of [name], MD.   I, [name], MD, have reviewed all documentation for this visit. The documentation on 03/09/23 for the exam, diagnosis, procedures, and orders are all accurate and complete.

## 2023-02-27 NOTE — Patient Instructions (Addendum)
She has tested negative for influenza and COVID-19.  Still it seems that she has an acute illness with chills and 1 episode of vomiting.  Says a lot of people at work are ill.  I am sending in Zithromax Z-PAK 2 tabs day 1 followed by 1 tab days 2 through 5.  May take Hycodan 1 teaspoon every 8 hours as needed for cough.  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  Needs to wear a mask at work.  May take Diflucan 150 mg tablet if she develops Candida vaginitis while taking Zithromax.

## 2023-03-31 ENCOUNTER — Ambulatory Visit (INDEPENDENT_AMBULATORY_CARE_PROVIDER_SITE_OTHER): Payer: 59 | Admitting: Family Medicine

## 2023-03-31 ENCOUNTER — Encounter (INDEPENDENT_AMBULATORY_CARE_PROVIDER_SITE_OTHER): Payer: Self-pay | Admitting: Family Medicine

## 2023-03-31 VITALS — BP 141/82 | HR 64 | Temp 97.9°F | Ht 64.0 in | Wt 278.0 lb

## 2023-03-31 DIAGNOSIS — Z9884 Bariatric surgery status: Secondary | ICD-10-CM | POA: Diagnosis not present

## 2023-03-31 DIAGNOSIS — K219 Gastro-esophageal reflux disease without esophagitis: Secondary | ICD-10-CM | POA: Diagnosis not present

## 2023-03-31 DIAGNOSIS — E66813 Obesity, class 3: Secondary | ICD-10-CM | POA: Diagnosis not present

## 2023-03-31 DIAGNOSIS — Z6841 Body Mass Index (BMI) 40.0 and over, adult: Secondary | ICD-10-CM | POA: Diagnosis not present

## 2023-03-31 DIAGNOSIS — Z0289 Encounter for other administrative examinations: Secondary | ICD-10-CM

## 2023-03-31 NOTE — Progress Notes (Signed)
Emma Grippe, DO, ABFM, ABOM Bariatric physician [address] [place], [place], [place] [address] Office: [phone]  /  Fax: [phone]     Initial Evaluation:   Emma Gonzales was seen in clinic today to evaluate for obesity. She is interested in losing weight to improve overall health and reduce the risk of weight related complications. She presents today to review program treatment options, initial physical assessment, and evaluation.     She was referred by: Specialist - Dr.Christopher Magnus Ivan, MD  When asked how has your weight affected you? She states: Contributed to medical problems, Contributed to orthopedic problems or mobility issues, Having fatigue, and Having poor endurance  Contributing factors to her weight change: Family history of obesity, Disruption of circadian rhythm / sleep disordered breathing, Reduced physical activity, and nutritional  Some associated conditions: Arthritis, OSA (pt supposed to be wearing CPAP nightly, but has not been in the past 5 years), GERD, Gastric bypass.   Current nutrition plan: None  Current level of physical activity: None  Current or previous pharmacotherapy: Phentermine & Metformin approximately 3 years through a private practice bariatric clinic through [facility].   Response to medication: Ineffective so it was discontinued  @[employer] @    Past Medical History:  Diagnosis Date   Anxiety attack 01-24-11   previoulsy and only once   GERD (gastroesophageal reflux disease) 01-24-11   reflux with hiatal hernia-no regular meds   Hiatal hernia 01-24-11   hx. of this recent dx./no nerve issues   Leg pain    Obesity    PONV (postoperative nausea and vomiting) 01-24-11   pt. has PONV with anesthesia occ.   Sleep apnea 01-24-11   no cpap used   Varicose vein of leg    BILATERAL     Objective:  BP (!) 141/82   Pulse 64   Temp 97.9 F (36.6 C)   Ht 5\' 4"  (1.626 m)   Wt 278 lb (126.1 kg)   SpO2 100%   BMI 47.72  kg/m  She was weighed on the bioimpedance scale: Body mass index is 47.72 kg/m.  Visceral Fat %: 19 , Body Fat %: 53.2   Weight Lost Since Last Visit: N/A  Weight Gained Since Last Visit: N/A  Vitals Temp: 97.9 F (36.6 C) BP: (!) 141/82 Pulse Rate: 64 SpO2: 100 %   Anthropometric Measurements Height: 5\' 4"  (1.626 m) Weight: 278 lb (126.1 kg) BMI (Calculated): 47.7 Weight at Last Visit: N/A Weight Lost Since Last Visit: N/A Weight Gained Since Last Visit: N/A Starting Weight: N/A   Body Composition  Body Fat %: 53.2 % Fat Mass (lbs): 148.4 lbs Muscle Mass (lbs): 123.8 lbs Total Body Water (lbs): 96.6 lbs Visceral Fat Rating : 19   Other Clinical Data Fasting: NO Labs: NO Today's Visit #: INFO Comments: INFORMATION SESSION    General: Well Developed, well nourished, and in no acute distress.  HEENT: Normocephalic, atraumatic; EOMI, sclerae are anicteric. Skin: Warm and dry, good turgor Chest:  Normal excursion, shape, no gross ABN Respiratory: No conversational dyspnea; speaking in full sentences NeuroM-Sk:  Normal gross ROM * 4 extremities  Psych: A and O *3, insight adequate, mood- full   Assessment and Plan:   FOR THE DISEASE OF OBESITY:  Class 3 severe obesity due to excess calories without serious comorbidity with body mass index (BMI) of 45.0 to 49.9 in adult [facility]) Assessment & Plan: - We discussed obesity as a disease and the importance of a more detailed evaluation  of all the factors contributing to the disease.  - We reviewed weight, biometrics, associated medical conditions and contributing factors with patient. she would benefit from weight loss therapy via a modified calorie, low-carb, high-protein nutritional plan tailored to their REE (resting energy expenditure) which will be determined by indirect calorimetry at their next office visit.     Multifaceted obesity treatment plan: Action Plan: - she was weighed on the bioimpedance scale and  results were discussed and documented in the synopsis.   Emma Gonzales will complete provided nutritional and psychosocial assessment questionnaire before the next appointment.  - she will be scheduled for indirect calorimetry to determine resting energy expenditure in a fasting state.  This will allow [place] to create a reduced calorie, high-protein meal plan to promote loss of fat mass while preserving muscle mass.  - We will also assess for cardiometabolic risk and nutritional derangements via an ECG and fasting serologies at her next appointment.  - she was encouraged to work on amassing support from family and friends to begin their weight loss journey.   - Work on eliminating or reducing the presence of highly processed, poorly nutritious, calorie-dense foods in the home.   Obesity Education Performed Today: - Patient was counseled on nutritional approaches to weight loss and benefits of reducing processed foods and consuming plant-based foods and high quality protein as part of nutritional weight management program.   - We discussed the importance of long term lifestyle changes which include nutrition, exercise and behavioral modifications as well as the importance of customizing this to her specific health and social needs.   - We discussed the benefits of reaching a healthier weight to alleviate the symptoms of existing conditions and reduce the risks of the biomechanical, metabolic and psychological effects of obesity.  - Was counseled on the health benefits of losing 5%-10% of total body weight.  - Was counseled on our cognitive behavorial therapy program, lead by our bariatric psychologist, who focuses on emotional eating and creating positive behavorial change.  - Was counseled on bariatric pharmacotherapy and how this may be used as an adjunct in their weight management   Emma Gonzales appears to be in the action stage of change and states they are ready to start intensive  lifestyle modifications and behavioral modifications.  It was recommended that she follow up in the next 1-2 weeks to review the above steps, and to continue with treatment of their chronic disease state of obesity  FOR OTHER CONDITIONS RELATED TO THE DISEASE OF OBESITY:  Gastroesophageal reflux disease, unspecified whether esophagitis present Assessment & Plan: Pt takes Protonix as needed. Symptoms are essentially controlled and she has no concerns today about this condition or it's treatment regimen. Weight loss and healthy eating can further help manage condition through reduced pressure on the stomach, improved digestion,and reduced inflammation. Continue with medication regimen.    Lap gastric bypass Nov 2012 Assessment & Plan: Pt with hx of gastric bypass with [facility] in Nov 2012. Condition stable. If pt decides to join program, we will start her on a high protein, low carbohydrate prudent nutritional plan tailored to her metabolism. Will obtain a full set of bariatric labs to screen for any nutritional deficiencies.  Attestations:   Reviewed by clinician on day of visit: allergies, medications, problem list, medical history, surgical history, family history, social history, and previous encounter notes pertinent to obesity diagnosis. 43 minutes was spent today on this visit including the above counseling, pre-visit chart review,  and post-visit documentation.  Over 50% of this time was spent in direct, face-to-face counseling and coordination of care  I, Special Randolm Idol , acting as a medical scribe for Thomasene Lot, DO., have compiled all relevant documentation for today's office visit on behalf of Thomasene Lot, DO, while in the presence of [employer], DO.  I have reviewed the above documentation for accuracy and completeness, and I agree with the above. Emma Gonzales, D.O.  The 21st Century Cures Act was signed into law in 2016 which includes the topic of electronic  health records.  This provides immediate access to information in MyChart.  This includes consultation notes, operative notes, office notes, lab results and pathology reports.  If you have any questions about what you read please let us know at your next visit so we can discuss your concerns and take corrective action if need be.  We are right here with you!

## 2023-04-08 ENCOUNTER — Other Ambulatory Visit: Payer: 59

## 2023-04-08 DIAGNOSIS — E66813 Obesity, class 3: Secondary | ICD-10-CM

## 2023-04-08 DIAGNOSIS — Z1329 Encounter for screening for other suspected endocrine disorder: Secondary | ICD-10-CM

## 2023-04-08 DIAGNOSIS — Z13220 Encounter for screening for lipoid disorders: Secondary | ICD-10-CM

## 2023-04-08 DIAGNOSIS — K219 Gastro-esophageal reflux disease without esophagitis: Secondary | ICD-10-CM

## 2023-04-08 DIAGNOSIS — Z0000 Encounter for general adult medical examination without abnormal findings: Secondary | ICD-10-CM

## 2023-04-08 DIAGNOSIS — M79604 Pain in right leg: Secondary | ICD-10-CM

## 2023-04-10 ENCOUNTER — Encounter: Payer: 59 | Admitting: Internal Medicine

## 2023-04-21 ENCOUNTER — Encounter (INDEPENDENT_AMBULATORY_CARE_PROVIDER_SITE_OTHER): Payer: Self-pay | Admitting: Family Medicine

## 2023-04-21 ENCOUNTER — Ambulatory Visit (INDEPENDENT_AMBULATORY_CARE_PROVIDER_SITE_OTHER): Payer: 59 | Admitting: Family Medicine

## 2023-04-21 DIAGNOSIS — Z91199 Patient's noncompliance with other medical treatment and regimen due to unspecified reason: Secondary | ICD-10-CM

## 2023-04-23 ENCOUNTER — Other Ambulatory Visit: Payer: Self-pay | Admitting: Physician Assistant

## 2023-04-28 NOTE — Progress Notes (Signed)
 Did not have papers completed/ or did not show

## 2023-05-06 ENCOUNTER — Ambulatory Visit (INDEPENDENT_AMBULATORY_CARE_PROVIDER_SITE_OTHER): Payer: 59 | Admitting: Family Medicine

## 2023-05-07 ENCOUNTER — Encounter: Payer: Self-pay | Admitting: Orthopaedic Surgery

## 2023-05-07 ENCOUNTER — Ambulatory Visit (INDEPENDENT_AMBULATORY_CARE_PROVIDER_SITE_OTHER): Payer: 59 | Admitting: Orthopaedic Surgery

## 2023-05-07 VITALS — Wt 277.0 lb

## 2023-05-07 DIAGNOSIS — M1711 Unilateral primary osteoarthritis, right knee: Secondary | ICD-10-CM

## 2023-05-07 DIAGNOSIS — M25561 Pain in right knee: Secondary | ICD-10-CM

## 2023-05-07 DIAGNOSIS — G8929 Other chronic pain: Secondary | ICD-10-CM

## 2023-05-07 DIAGNOSIS — M25562 Pain in left knee: Secondary | ICD-10-CM | POA: Diagnosis not present

## 2023-05-07 DIAGNOSIS — M1712 Unilateral primary osteoarthritis, left knee: Secondary | ICD-10-CM

## 2023-05-07 NOTE — Progress Notes (Signed)
 The patient is a 58 year old well-known to me.  She has debilitating arthritis involving both her knees.  She comes in today for a repeat weight and BMI calculation.  Today's weight she is 277 pounds and her BMI is 47.55.  At her last visit with [place] her BMI was 48.58.  Both knees have significant varus malalignment.  Both knees are significantly and I believe the left ones a little worse than the right.  She does not have a very large soft tissue envelope around either knee.  She is attending weight loss clinic and has another visit coming up in March.  I would like to see her back myself in about 6 weeks with a repeat weight and BMI calculation.  If she is continuing to trend downward, we will consider scheduling her for surgery for knee replacement.  She understands there is a higher risk of complications given her BMI but she is not a diabetic and I do believe surgery would help her quite a bit.  She is working on her diet as well in the interim.  Again her next visit we will have a repeat weight and BMI calculation but no x-rays are needed.

## 2023-05-14 ENCOUNTER — Encounter (INDEPENDENT_AMBULATORY_CARE_PROVIDER_SITE_OTHER): Payer: Self-pay | Admitting: Family Medicine

## 2023-05-14 ENCOUNTER — Emergency Department (HOSPITAL_COMMUNITY)
Admission: EM | Admit: 2023-05-14 | Discharge: 2023-05-14 | Disposition: A | Discharge status: Home / Self Care | Attending: Emergency Medicine | Admitting: Emergency Medicine

## 2023-05-14 ENCOUNTER — Telehealth: Payer: Self-pay | Admitting: Cardiology

## 2023-05-14 ENCOUNTER — Encounter (HOSPITAL_COMMUNITY): Payer: Self-pay

## 2023-05-14 ENCOUNTER — Other Ambulatory Visit: Payer: Self-pay

## 2023-05-14 ENCOUNTER — Telehealth (INDEPENDENT_AMBULATORY_CARE_PROVIDER_SITE_OTHER): Payer: Self-pay

## 2023-05-14 ENCOUNTER — Ambulatory Visit (INDEPENDENT_AMBULATORY_CARE_PROVIDER_SITE_OTHER): Payer: 59 | Admitting: Family Medicine

## 2023-05-14 VITALS — BP 146/79 | HR 65 | Temp 98.0°F | Ht 64.0 in | Wt 270.0 lb

## 2023-05-14 DIAGNOSIS — R7303 Prediabetes: Secondary | ICD-10-CM | POA: Insufficient documentation

## 2023-05-14 DIAGNOSIS — R9431 Abnormal electrocardiogram [ECG] [EKG]: Secondary | ICD-10-CM | POA: Diagnosis present

## 2023-05-14 DIAGNOSIS — I10 Essential (primary) hypertension: Secondary | ICD-10-CM | POA: Diagnosis not present

## 2023-05-14 DIAGNOSIS — R5383 Other fatigue: Secondary | ICD-10-CM

## 2023-05-14 DIAGNOSIS — R0602 Shortness of breath: Secondary | ICD-10-CM

## 2023-05-14 DIAGNOSIS — Z9884 Bariatric surgery status: Secondary | ICD-10-CM

## 2023-05-14 DIAGNOSIS — E559 Vitamin D deficiency, unspecified: Secondary | ICD-10-CM | POA: Diagnosis not present

## 2023-05-14 DIAGNOSIS — Z6841 Body Mass Index (BMI) 40.0 and over, adult: Secondary | ICD-10-CM

## 2023-05-14 DIAGNOSIS — E538 Deficiency of other specified B group vitamins: Secondary | ICD-10-CM

## 2023-05-14 DIAGNOSIS — G4730 Sleep apnea, unspecified: Secondary | ICD-10-CM | POA: Diagnosis not present

## 2023-05-14 DIAGNOSIS — M1712 Unilateral primary osteoarthritis, left knee: Secondary | ICD-10-CM

## 2023-05-14 DIAGNOSIS — Z1331 Encounter for screening for depression: Secondary | ICD-10-CM | POA: Diagnosis not present

## 2023-05-14 DIAGNOSIS — F1010 Alcohol abuse, uncomplicated: Secondary | ICD-10-CM

## 2023-05-14 LAB — CBC
HCT: 39.9 % (ref 36.0–46.0)
Hemoglobin: 12.2 g/dL (ref 12.0–15.0)
MCH: 27.1 pg (ref 26.0–34.0)
MCHC: 30.6 g/dL (ref 30.0–36.0)
MCV: 88.7 fL (ref 80.0–100.0)
Platelets: 287 10*3/uL (ref 150–400)
RBC: 4.5 MIL/uL (ref 3.87–5.11)
RDW: 17.6 % — ABNORMAL HIGH (ref 11.5–15.5)
WBC: 5.3 10*3/uL (ref 4.0–10.5)
nRBC: 0 % (ref 0.0–0.2)

## 2023-05-14 LAB — BASIC METABOLIC PANEL
Anion gap: 10 (ref 5–15)
BUN: 14 mg/dL (ref 6–20)
CO2: 24 mmol/L (ref 22–32)
Calcium: 8.9 mg/dL (ref 8.9–10.3)
Chloride: 108 mmol/L (ref 98–111)
Creatinine, Ser: 0.81 mg/dL (ref 0.44–1.00)
GFR, Estimated: >60 mL/min (ref >60)
Glucose, Bld: 80 mg/dL (ref 70–99)
Potassium: 3.9 mmol/L (ref 3.5–5.1)
Sodium: 142 mmol/L (ref 135–145)

## 2023-05-14 LAB — TROPONIN I (HIGH SENSITIVITY): Troponin I (High Sensitivity): 5 ng/L (ref <18)

## 2023-05-14 NOTE — Telephone Encounter (Signed)
 [facility] is calling to talk with triage time about abnormal ekg

## 2023-05-14 NOTE — Assessment & Plan Note (Signed)
 Patients EKG today showing both artifact and numerous PACs.  This is a change from her prior EKG.  Cardiac workup in 2022 in November of stress test and echo done.  She has a cardiologist.  CMA called [facility] to see if they can see patient today and review her EKG.  She denies any chest pain, chest pressure, nausea, jaw pain.

## 2023-05-14 NOTE — Assessment & Plan Note (Signed)
 Historical diagnosis.  Needs repeat B12 level today.  H/O gastric bypass.

## 2023-05-14 NOTE — Telephone Encounter (Signed)
 Received EKG via fax from [facility]. Brought EKG to DOD (Dr. Jimmey Ralph). Jimmey Ralph stated that the EKG does not appear to show a STEMI but has a lot of artifact making it difficult to decipher. Jimmey Ralph stated she would need to have a repeat EKG which she can have done at the ED or an Urgent Care. Pt has been admitted to the ED at [facility].

## 2023-05-14 NOTE — Assessment & Plan Note (Signed)
 Sees Dr. Magnus Ivan- told she needs a knee replacement.  Patient is not in pain currently so does not feel like she needs surgery at this time.

## 2023-05-14 NOTE — Assessment & Plan Note (Signed)
 Last A1c in 2012 of 6.4.  Needs repeat A1c and fasting insulin level today.

## 2023-05-14 NOTE — Assessment & Plan Note (Signed)
 Last Vitamin D level in the 20s 2 years ago.  On a multivitamin daily.  Needs repeat Vitamin D level today.

## 2023-05-14 NOTE — Assessment & Plan Note (Signed)
 Patient is drinking daily.  She is trying to get it better controlled.  Has not done an outpatient detox or AA.  She is not interested in any assistance at this time.

## 2023-05-14 NOTE — Discharge Instructions (Addendum)
 Your EKG and blood tests including your heart enzyme levels were reassuring and normal here in the ER.  Please follow-up with your primary care doctor or your cardiologist if you see one.  That is very common for EKGs to change depending on issues like movement, deep breathing, and where the sticker leads are placed on your chest.  If you do develop chest pressure or difficulty breathing, sudden lightheadedness, or any other emergency concerns, reach out to your doctor or return to the ER.  Please aware that your blood pressure was also high today.  This can be common when people in the emergency room due to pain or anxiety.  Please continue to monitor this at home and if her blood pressure remains high follow-up with your doctor.

## 2023-05-14 NOTE — Telephone Encounter (Signed)
 Spoke with staff member of [employer] [facility] [employer] regarding pt's abnormal EKG. Staff member stated the pt was having sob but that she was not with the pt and did not know of any other symptoms she may be having. Staff member also stated that she did not know what the EKG was showing. Staff member was asked to fax the EKG to our same day number before later letting us know that the EKG said acute MI STEMI. Staff member was advised to send pt to the ED for evaluation. Staff member stated that the pt had already left the facility but that she would try to go get her and let her know that she needed to go to the ED.

## 2023-05-14 NOTE — Telephone Encounter (Signed)
 Due to patients EKG done in the office today, we tried to call her cardiologist to get her an appointment in their office today. They asked that I fax the EKG over and advised that she go to the ER instead. I called the patient and told her what they advised, and she agreed that she would go to [facility] to be evaluated today.

## 2023-05-14 NOTE — ED Provider Notes (Signed)
 [place] EMERGENCY DEPARTMENT AT [facility] Provider Note   CSN: [id number] Arrival date & time: 05/14/23  1002     History  Chief Complaint  Patient presents with   Abnormal ECG    Emma Gonzales is a 58 y.o. female history of high blood pressure, obesity, presented to the ED with complaint of abnormal EKG.  Patient went to her weight loss clinic today and had an EKG performed and there was concerns EKG was "abnormal" and she was referred to the ED for further evaluation.  She says she became very anxious when she heard about that abnormal finding.  Supportive family history of MI in her father and mother, her father in her 71s and mother in her 75s.  She also reports borderline high cholesterol.  She does not smoke or vape.  She denies known history of diabetes.  She denies any chest pain or pressure, shortness of breath, lightheadedness  HPI     Home Medications Prior to Admission medications   Medication Sig Start Date End Date Taking? Authorizing Provider  hydrocortisone 2.5 % cream Apply 1 application topically 2 (two) times daily. 09/09/20   [provider]  naproxen (NAPROSYN) 375 MG tablet Take 1 tablet (375 mg total) by mouth 2 (two) times daily. 08/25/22   Al Decant, PA-C  naproxen (NAPROSYN) 500 MG tablet TAKE 1 TABLET (500 MG TOTAL) BY MOUTH 2 (TWO) TIMES DAILY BETWEEN MEALS AS NEEDED. 04/23/23   Kathryne Hitch, MD  pantoprazole (PROTONIX) 40 MG tablet TAKE 1 TABLET BY MOUTH EVERY DAY Patient taking differently: Take 40 mg by mouth daily. PRN 01/16/23   [name], MD      Allergies    Amoxicillin    Review of Systems   Review of Systems  Physical Exam Updated Vital Signs BP (!) 180/88   Pulse 65   Temp 97.9 F (36.6 C) (Oral)   Resp 16   Ht 5\' 4"  (1.626 m)   Wt 125.2 kg   LMP  (LMP Unknown)   SpO2 97%   BMI 47.38 kg/m  Physical Exam Constitutional:      General: She is not in acute distress. HENT:     Head:  Normocephalic and atraumatic.  Eyes:     Conjunctiva/sclera: Conjunctivae normal.     Pupils: Pupils are equal, round, and reactive to light.  Cardiovascular:     Rate and Rhythm: Normal rate and regular rhythm.  Pulmonary:     Effort: Pulmonary effort is normal. No respiratory distress.  Abdominal:     General: There is no distension.     Tenderness: There is no abdominal tenderness.  Skin:    General: Skin is warm and dry.  Neurological:     General: No focal deficit present.     Mental Status: She is alert. Mental status is at baseline.  Psychiatric:        Mood and Affect: Mood normal.        Behavior: Behavior normal.     ED Results / Procedures / Treatments   Labs (all labs ordered are listed, but only abnormal results are displayed) Labs Reviewed  CBC - Abnormal; Notable for the following components:      Result Value   RDW 17.6 (*)    All other components within normal limits  BASIC METABOLIC PANEL  TROPONIN I (HIGH SENSITIVITY)    EKG EKG Interpretation Date/Time:  Wednesday May 14 2023 10:20:44 EST Ventricular Rate:  60 PR Interval:  135 QRS Duration:  96 QT Interval:  465 QTC Calculation: 465 R Axis:   38  Text Interpretation: Sinus rhythm Probable left atrial enlargement Confirmed by Alvester Chou [phone]) on 05/14/2023 10:36:03 AM  Radiology No results found.  Procedures Procedures    Medications Ordered in ED Medications - No data to display  ED Course/ Medical Decision Making/ A&P                                 Medical Decision Making Amount and/or Complexity of Data Reviewed Labs: ordered.   Patient is here with report due to concern of abnormal outpatient EKG.  Unfortunately not able to view that EKG directly, but I did review the external records.  From the cardiology office records or telephone visit today, it appears there was significant baseline artifact on the outpatient EKG, and there was "no clear signs of STEMI."    The  patient's EKG on arrival shows a completely normal sinus rhythm with no ST elevations.  There is no artifact on this EKG.  She is asymptomatic and has not had any chest pain or pressure.  My suspicion for ACS is quite low.  I do think it is reasonable to check a single troponin level, this is negative evidence that the patient can be discharged with outpatient follow-up.  The patient is comfortable with that plan.  Low suspicion for sepsis, aortic dissection, PE.  No other acute complaints  *  I personally reviewed and interpreted patient's labs.  Troponin is negative.  Patient has asymptomatic hypertension which is likely driven by anxiety.  She can follow-up with her doctor for these issues.  Okay for discharge        Final Clinical Impression(s) / ED Diagnoses Final diagnoses:  Abnormal EKG  Hypertension, unspecified type    Rx / DC Orders ED Discharge Orders     None         Lucus Lambertson, Kermit Balo, MD 05/14/23 1215

## 2023-05-14 NOTE — Telephone Encounter (Signed)
 We received a cardiology report from [facility]. Its scanned in media under: CARDIOLOGY REPORT [facility]

## 2023-05-14 NOTE — Assessment & Plan Note (Signed)
 Lost significant amount of weight initially in the first 6 months. Lost to 208. Had surgery on her foot a few months after and then had a cast on her foot and unfortunately started gaining weight.  Reports weight of 400lbs prior to surgery.

## 2023-05-14 NOTE — Assessment & Plan Note (Signed)
 Sleep study done prior to her gastric bypass.  She does not use a CPAP currently.  May need repeat workup if interested.

## 2023-05-14 NOTE — ED Triage Notes (Signed)
 Patient had EKG at her doctor today. They said it was abnormal. Denies any chest pain, dizziness, shortness of breath. No symptoms. Feels anxious now.

## 2023-05-14 NOTE — Progress Notes (Signed)
 Chief Complaint:  Obesity   Subjective:  Emma Gonzales (MR# [id number]) is a 58 y.o. female who presents for evaluation and treatment of obesity and related comorbidities.   Emma Gonzales is currently in the action stage of change and ready to dedicate time achieving and maintaining a healthier weight. Emma Gonzales is interested in becoming our patient and working on intensive lifestyle modifications including (but not limited to) diet and exercise for weight loss.  Emma Gonzales has been struggling with her weight. She has been unsuccessful in either losing weight, maintaining weight loss, or reaching her healthy weight goal.  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.  She stands and packs for 40hours.  Typical hours are 7-3:30 M-F. Lives at home with her fiance Emma Gonzales (getting married April 19th) who is supportive of her, eats meals with her and will be changing how he eats with her.  Desired weight is 220lbs- last time she was that weight was years ago.  Previously tried [employer] Diet and lost 10lbs.   Food Recall: Breakfast is yogurt- one individual cup of yogurt or 2 boiled eggs.  Feels satisfied. Lunch is a lean cuisine- feels satisfied.  Eats again when she gets home for dinner and could be late- sometimes it is 7pm- 2 barbecue chicken legs and mashed potatoes (1 cup).  Felt full.  Drinks between 1-2 beers a day.   Indirect Calorimeter completed today shows a RMR: 907. Her calculated basal metabolic rate is 0102  thus her basal metabolic rate is worse than expected.  Other Fatigue Emma Gonzales admits to daytime somnolence and admits to waking up still tired. Patient has a history of symptoms of daytime fatigue. Emma Gonzales generally gets 5 or 6 hours of sleep per night, and states that she has difficulty falling asleep. Snoring is present. Apneic episodes are present. Epworth Sleepiness Score is 0.   Shortness of Breath Analys notes increasing shortness of breath with exercising and seems to be worsening over time with  weight gain. She notes getting out of breath sooner with activity than she used to. This has not gotten worse recently. Emma Gonzales denies shortness of breath at rest or orthopnea.  Depression Screen Emma Gonzales's Food and Mood (modified PHQ-9) score was 3.     05/14/2023   12:36 PM  Depression screen PHQ 2/9  Decreased Interest 2  Down, Depressed, Hopeless 1  PHQ - 2 Score 3  Altered sleeping 0  Tired, decreased energy 0  Change in appetite 2  Feeling bad or failure about yourself  0  Trouble concentrating 0  Moving slowly or fidgety/restless 0  Suicidal thoughts 0  PHQ-9 Score 5     Objective:  Vitals Temp: 98 F (36.7 C) BP: (!) 178/86 Pulse Rate: 68 SpO2: 98 %   Anthropometric Measurements Height: 5\' 4"  (1.626 m) Weight: 276 lb (125.2 kg) BMI (Calculated): 47.35 Weight at Last Visit: NA Weight Lost Since Last Visit: NA Weight Gained Since Last Visit: NA Starting Weight: 270 lb Total Weight Loss (lbs): 0 lb (0 kg) Peak Weight: 400 lb Waist Measurement : 54 inches   Body Composition  Body Fat %: 53 % Fat Mass (lbs): 143.4 lbs Muscle Mass (lbs): 120.8 lbs Total Body Water (lbs): 93.6 lbs Visceral Fat Rating : 19   Other Clinical Data RMR: 907 Fasting: Yes Labs: Yes Today's Visit #: 1 Starting Date: 05/14/23 Comments: First Visit    EKG: Normal sinus rhythm, rate 66.  General: Cooperative, alert, well developed, in no acute distress.  HEENT: Conjunctivae and lids unremarkable. Cardiovascular: Regular rhythm.  Lungs: Normal work of breathing. Neurologic: No focal deficits.   Lab Results  Component Value Date   CREATININE 0.81 05/14/2023   BUN 14 05/14/2023   NA 142 05/14/2023   K 3.9 05/14/2023   CL 108 05/14/2023   CO2 24 05/14/2023   Lab Results  Component Value Date   ALT 14 04/26/2019   AST 16 04/26/2019   ALKPHOS 65 11/22/2014   BILITOT 0.5 04/26/2019   Lab Results  Component Value Date   HGBA1C 6.4 (H) 10/24/2010   No results found for:  "INSULIN" Lab Results  Component Value Date   TSH 0.775 01/19/2021   Lab Results  Component Value Date   CHOL 185 04/26/2019   HDL 89 04/26/2019   LDLCALC 80 04/26/2019   TRIG 76 04/26/2019   CHOLHDL 2.1 04/26/2019   Lab Results  Component Value Date   WBC 5.3 05/14/2023   HGB 12.2 05/14/2023   HCT 39.9 05/14/2023   MCV 88.7 05/14/2023   PLT 287 05/14/2023   Lab Results  Component Value Date   IRON 110 08/06/2012   TIBC 259 08/06/2012    Assessment and Plan:   Other Fatigue  Emma Gonzales does feel that her weight is causing her energy to be lower than it should be. Fatigue may be related to obesity, depression or many other causes. Labs will be ordered, and in the meanwhile, Emma Gonzales will focus on self care including making healthy food choices, increasing physical activity and focusing on stress reduction.  Shortness of Breath  Emma Gonzales does feel that she gets out of breath more easily that she used to when she exercises. 's shortness of breath appears to be obesity related and exercise induced. She has agreed to work on weight loss and gradually increase exercise to treat her exercise induced shortness of breath. Will continue to monitor closely.   Problem List Items Addressed This Visit       Respiratory   Sleep apnea   Sleep study done prior to her gastric bypass.  She does not use a CPAP currently.  May need repeat workup if interested.      Relevant Orders   CBC w/Diff/Platelet     Musculoskeletal and Integument   Osteoarthritis of left knee   Sees Dr. Magnus Ivan- told she needs a knee replacement.  Patient is not in pain currently so does not feel like she needs surgery at this time.        Other   Lap gastric bypass Nov 2012   Lost significant amount of weight initially in the first 6 months. Lost to 208. Had surgery on her foot a few months after and then had a cast on her foot and unfortunately started gaining weight.  Reports weight of 400lbs prior to surgery.        Relevant Orders   Prealbumin   Vitamin D deficiency   Last Vitamin D level in the 20s 2 years ago.  On a multivitamin daily.  Needs repeat Vitamin D level today.      Relevant Orders   VITAMIN D 25 Hydroxy (Vit-D Deficiency, Fractures)   Prediabetes   Last A1c in 2012 of 6.4.  Needs repeat A1c and fasting insulin level today.      Relevant Orders   Comprehensive metabolic panel   Hemoglobin A1c   Insulin, random   B12 deficiency   Historical diagnosis.  Needs repeat B12 level today.  H/O gastric bypass.  Alcohol abuse   Patient is drinking daily.  She is trying to get it better controlled.  Has not done an outpatient detox or AA.  She is not interested in any assistance at this time.      Relevant Orders   Vitamin B12   Folate   Abnormal EKG   Patients EKG today showing both artifact and numerous PACs.  This is a change from her prior EKG.  Cardiac workup in 2022 in November of stress test and echo done.  She has a cardiologist.  CMA called [facility] to see if they can see patient today and review her EKG.  She denies any chest pain, chest pressure, nausea, jaw pain.        Other Visit Diagnoses       SOB (shortness of breath) on exertion    -  Primary   Relevant Orders   Lipid Panel With LDL/HDL Ratio     Other fatigue       Relevant Orders   EKG 12-Lead   Thyroid Panel With TSH     Depression screen         Morbid obesity starting BMI of 46.35         BMI 45.0-49.9, adult (HCC)           Emma Gonzales is currently in the action stage of change and her goal is to continue with weight loss efforts. I recommend Emma Gonzales begin the structured treatment plan as follows:  She has agreed to Category 1 Plan  Exercise goals: No exercise has been prescribed at this time.  Behavioral modification strategies:increasing lean protein intake, decreasing simple carbohydrates, decrease liquid calories, meal planning and cooking strategies, and planning for success  She was  informed of the importance of frequent follow-up visits to maximize her success with intensive lifestyle modifications for her multiple health conditions. She was informed we would discuss her lab results at her next visit unless there is a critical issue that needs to be addressed sooner. Emma Gonzales agreed to keep her next visit at the agreed upon time to discuss these results.  Labs ordered with plans to discuss at the next visit.   Attestation Statements:  Reviewed by clinician on day of visit: allergies, medications, problem list, medical history, surgical history, family history, social history, and previous encounter notes. This is the patient's first visit at [facility]. The patient's NEW PATIENT PACKET was reviewed at length. Included in the packet: current and past health history, medications, allergies, ROS, gynecologic history (women only), surgical history, family history, social history, weight history, weight loss surgery history (for those that have had weight loss surgery), nutritional evaluation, mood and food questionnaire, PHQ9, Epworth questionnaire, sleep habits questionnaire, patient life and health improvement goals questionnaire. These will all be scanned into the patient's chart under media.   During the visit, I independently reviewed the patient's EKG, bioimpedance scale results, and indirect calorimeter results. I used this information to tailor a meal plan for the patient that will help her to lose weight and will improve her obesity-related conditions going forward. I performed a medically necessary appropriate examination and/or evaluation. I discussed the assessment and treatment plan with the patient. The patient was provided an opportunity to ask questions and all were answered. The patient agreed with the plan and demonstrated an understanding of the instructions. Labs were ordered at this visit and will be reviewed at the next visit unless more critical results  need to be addressed  immediately. Clinical information was updated and documented in the EMR.    Time spent on visit including pre-visit chart review and post-visit charting and care was 45 minutes.   Reuben Likes, MD

## 2023-05-15 LAB — INSULIN, RANDOM: INSULIN: 5.8 u[IU]/mL (ref 2.6–24.9)

## 2023-05-15 LAB — HEMOGLOBIN A1C
Est. average glucose Bld gHb Est-mCnc: 120 mg/dL
Hgb A1c MFr Bld: 5.8 % — ABNORMAL HIGH (ref 4.8–5.6)

## 2023-05-15 LAB — CBC WITH DIFFERENTIAL/PLATELET
Basophils Absolute: 0 10*3/uL (ref 0.0–0.2)
Basos: 1 %
EOS (ABSOLUTE): 0.2 10*3/uL (ref 0.0–0.4)
Eos: 5 %
Hematocrit: 39.3 % (ref 34.0–46.6)
Hemoglobin: 11.8 g/dL (ref 11.1–15.9)
Immature Grans (Abs): 0 10*3/uL (ref 0.0–0.1)
Immature Granulocytes: 0 %
Lymphocytes Absolute: 1.7 10*3/uL (ref 0.7–3.1)
Lymphs: 36 %
MCH: 26.2 pg — ABNORMAL LOW (ref 26.6–33.0)
MCHC: 30 g/dL — ABNORMAL LOW (ref 31.5–35.7)
MCV: 87 fL (ref 79–97)
Monocytes Absolute: 0.4 10*3/uL (ref 0.1–0.9)
Monocytes: 8 %
Neutrophils Absolute: 2.4 10*3/uL (ref 1.4–7.0)
Neutrophils: 50 %
Platelets: 292 10*3/uL (ref 150–450)
RBC: 4.5 x10E6/uL (ref 3.77–5.28)
RDW: 16.1 % — ABNORMAL HIGH (ref 11.7–15.4)
WBC: 4.8 10*3/uL (ref 3.4–10.8)

## 2023-05-15 LAB — LIPID PANEL WITH LDL/HDL RATIO
Cholesterol, Total: 189 mg/dL (ref 100–199)
HDL: 77 mg/dL (ref >39)
LDL Chol Calc (NIH): 97 mg/dL (ref 0–99)
LDL/HDL Ratio: 1.3 ratio (ref 0.0–3.2)
Triglycerides: 82 mg/dL (ref 0–149)
VLDL Cholesterol Cal: 15 mg/dL (ref 5–40)

## 2023-05-15 LAB — COMPREHENSIVE METABOLIC PANEL
ALT: 15 IU/L (ref 0–32)
AST: 20 IU/L (ref 0–40)
Albumin: 4.1 g/dL (ref 3.8–4.9)
Alkaline Phosphatase: 112 IU/L (ref 44–121)
BUN/Creatinine Ratio: 13 (ref 9–23)
BUN: 12 mg/dL (ref 6–24)
Bilirubin Total: 0.3 mg/dL (ref 0.0–1.2)
CO2: 21 mmol/L (ref 20–29)
Calcium: 8.8 mg/dL (ref 8.7–10.2)
Chloride: 108 mmol/L — ABNORMAL HIGH (ref 96–106)
Creatinine, Ser: 0.91 mg/dL (ref 0.57–1.00)
Globulin, Total: 2.7 g/dL (ref 1.5–4.5)
Glucose: 70 mg/dL (ref 70–99)
Potassium: 4.7 mmol/L (ref 3.5–5.2)
Sodium: 146 mmol/L — ABNORMAL HIGH (ref 134–144)
Total Protein: 6.8 g/dL (ref 6.0–8.5)
eGFR: 74 mL/min/{1.73_m2} (ref >59)

## 2023-05-15 LAB — THYROID PANEL WITH TSH
Free Thyroxine Index: 1.6 (ref 1.2–4.9)
T3 Uptake Ratio: 29 % (ref 24–39)
T4, Total: 5.6 ug/dL (ref 4.5–12.0)
TSH: 1.19 u[IU]/mL (ref 0.450–4.500)

## 2023-05-15 LAB — PREALBUMIN: PREALBUMIN: 20 mg/dL (ref 10–36)

## 2023-05-15 LAB — FOLATE: Folate: 7.9 ng/mL (ref >3.0)

## 2023-05-15 LAB — VITAMIN D 25 HYDROXY (VIT D DEFICIENCY, FRACTURES): Vit D, 25-Hydroxy: 14.5 ng/mL — ABNORMAL LOW (ref 30.0–100.0)

## 2023-05-15 LAB — VITAMIN B12: Vitamin B-12: 269 pg/mL (ref 232–1245)

## 2023-05-20 ENCOUNTER — Ambulatory Visit: Payer: Self-pay | Admitting: Internal Medicine

## 2023-05-20 NOTE — Telephone Encounter (Signed)
  Chief Complaint: hypertension, ED follow up Symptoms: mild headache Frequency: 1 week ago Pertinent Negatives: Patient denies chest pain, SOB, changes in speech or vision, unsteady gait, unilateral numbness or weakness. Disposition: [] ED /[] Urgent Care (no appt availability in office) / [x] Appointment(In office/virtual)/ []  [facility]/ [] Home Care/ [] Refused Recommended Disposition /[] [facility]/ []  Follow-up with PCP Additional Notes: Patient seen in ED on 05/14/23 and BP was 180/88 and states last week it was also 146 on the top number. Mild headache today. Patient agreeable to hospital follow up Thursday with PCP. Patient verbalizes understanding to call back for new or worsening symptoms.  Copied From CRM [id number]. Reason for Triage: Blood pressure. Issue with bp for the last week  Reason for Disposition  [1] Systolic BP  >= 130 OR Diastolic >= 80 AND [2] not taking BP medications  Answer Assessment - Initial Assessment Questions 1. BLOOD PRESSURE: "What is the blood pressure?" "Did you take at least two measurements 5 minutes apart?"     SBP was 146 and unsure of bottom number.  2. ONSET: "When did you take your blood pressure?"     March 5th.  3. HOW: "How did you take your blood pressure?" (e.g., automatic home BP monitor, visiting nurse)     She states automatic machine at "a weight loss place."  4. HISTORY: "Do you have a history of high blood pressure?"     Denies.  5. MEDICINES: "Are you taking any medicines for blood pressure?" "Have you missed any doses recently?"     Denies.  6. OTHER SYMPTOMS: "Do you have any symptoms?" (e.g., blurred vision, chest pain, difficulty breathing, headache, weakness)     2/10 headache this morning.  Protocols used: Blood Pressure - High-A-AH

## 2023-05-22 ENCOUNTER — Ambulatory Visit (INDEPENDENT_AMBULATORY_CARE_PROVIDER_SITE_OTHER): Admitting: Internal Medicine

## 2023-05-22 ENCOUNTER — Encounter: Payer: Self-pay | Admitting: Internal Medicine

## 2023-05-22 VITALS — BP 138/80 | HR 79 | Temp 98.3°F | Wt 276.0 lb

## 2023-05-22 DIAGNOSIS — R7303 Prediabetes: Secondary | ICD-10-CM | POA: Diagnosis not present

## 2023-05-22 DIAGNOSIS — Z9884 Bariatric surgery status: Secondary | ICD-10-CM

## 2023-05-22 DIAGNOSIS — F439 Reaction to severe stress, unspecified: Secondary | ICD-10-CM | POA: Diagnosis not present

## 2023-05-22 DIAGNOSIS — I491 Atrial premature depolarization: Secondary | ICD-10-CM | POA: Diagnosis not present

## 2023-05-22 DIAGNOSIS — K219 Gastro-esophageal reflux disease without esophagitis: Secondary | ICD-10-CM

## 2023-05-22 MED ORDER — ERGOCALCIFEROL 1.25 MG (50000 UT) PO CAPS: 50000.0000 [IU] | ORAL_CAPSULE | ORAL | 3 refills | Status: DC

## 2023-05-22 MED ORDER — ALPRAZOLAM 0.5 MG PO TABS: 0.5000 mg | ORAL_TABLET | Freq: Every evening | ORAL | 2 refills | Status: AC | PRN

## 2023-05-22 MED ORDER — BUPROPION HCL ER (XL) 150 MG PO TB24: 150.0000 mg | ORAL_TABLET | Freq: Every day | ORAL | 1 refills | Status: AC

## 2023-05-22 NOTE — Progress Notes (Addendum)
 IMargaree Mackintosh, MD, have reviewed all documentation for this visit. The documentation on 06/08/23 for the exam, diagnosis, procedures, and orders are all accurate and complete.  Emma Care Team: [name], MD as PCP - General (Internal Medicine) Himmelrich, Loree Fee, RD (Inactive) as Dietitian  Visit Date: 05/22/23  Subjective:   Chief Complaint  Emma presents with   Follow-up    Emergency Dept   Emma Gonzales,Female DOB:[birth year],57 y.o. EAV:[id number]   Vitals:   05/22/23 1529 05/22/23 1539  BP: (!) 146/82 138/80  57 y.o. Female presents today for ED follow-up from 05/14/2023. Emma has a past medical history of PACs, Palpitations, Prediabetes. Presented to the ED after an EKG showing artifact and numerous PACs was noted during visit to [facility]. Staff contacted Cardiology office and ED visit was advised. BP that day was 178/86. Repeat EKG in ED showed Sinus rhythm; Probable left atrial enlargement. BMET normal. CBC was WNL. All of this was upsetting to the Emma. She works full time and is planning her wedding.  Past Medical History:  Diagnosis Date   Alcohol abuse    Anxiety attack 01/24/2011   previoulsy and only once   B12 deficiency    Depression    GERD (gastroesophageal reflux disease) 01/24/2011   reflux with hiatal hernia-no regular meds   Hiatal hernia 01/24/2011   hx. of this recent dx./no nerve issues   Leg pain    Obesity    PONV (postoperative nausea and vomiting) 01/24/2011   pt. has PONV with anesthesia occ.   Rheumatoid arthritis (HCC)    Sleep apnea 01/24/2011   no cpap used   Varicose vein of leg    BILATERAL   Vitamin D deficiency     Allergies  Allergen Reactions   Amoxicillin Other (See Comments)    Severe yeast infection Has Emma had a PCN reaction causing immediate rash, facial/tongue/throat swelling, SOB or lightheadedness with hypotension: no Has Emma had a PCN reaction causing severe  rash involving mucus membranes or skin necrosis: No Has Emma had a PCN reaction that required hospitalization: No Has Emma had a PCN reaction occurring within the last 10 years: Yes If all of the above answers are "NO", then may proceed with Cephalosporin use.     Family History  Problem Relation Age of Onset   Diabetes Mother    Heart disease Mother        Not sure what type of heart disease   Heart disease Father    Hypertension Father    Hyperlipidemia Father    Heart attack Father    Sleep apnea Father    Diabetes Brother      Social NW:GNFAOZH married in about a month. Job is going reasonably well with some stress.   Review of Systems  Respiratory: Negative.    Cardiovascular: Negative.      Objective:  Vitals: BP 138/80   Pulse 79   Temp 98.3 F (36.8 C)   Wt 276 lb (125.2 kg)   LMP  (LMP Unknown)   SpO2 96%   BMI 47.38 kg/m   Physical Exam Vitals and nursing note reviewed.  Constitutional:      General: She is not in acute distress.    Appearance: Normal appearance. She is not toxic-appearing.  HENT:     Head: Normocephalic and atraumatic.  Pulmonary:     Effort: Pulmonary effort is normal.  Skin:    General: Skin is warm and  dry.  Neurological:     Mental Status: She is alert and oriented to person, place, and time. Mental status is at baseline.  Psychiatric:        Mood and Affect: Mood normal.        Behavior: Behavior normal.        Thought Content: Thought content normal.        Judgment: Judgment normal.     Results:  Studies Obtained And Personally Reviewed By Me:  EKG 01/19/2021  Normal sinus rhythm, 67 bpm, normal axis, occasional PVCs, LAE, nonspecific T wave abnormality.    Exercise Treadmill Stress Test 01/29/2021 Exercise treadmill stress test performed using Bruce protocol.  Emma reached 4.6 METS, and 110% of age predicted maximum heart rate.  Exercise capacity was low.  No chest pain reported, dyspnea reported. Normal heart  rate response. Resting hypertension with hypertensive response. Peak BP 220/96 mmHg. Stress EKG revealed no ischemic changes.  02/03/2021 Echocardiogram 1. Left ventricle cavity is normal in size. Mild concentric hypertrophy of the left ventricle. Normal global wall motion. Normal LV systolic function with EF 63%. Normal diastolic filling pattern. 2. Left atrial cavity is mildly dilated. 3. Mild (Grade I) mitral regurgitation. 4. Mild tricuspid regurgitation. Estimated pulmonary artery systolic pressure 25 mmHg.  EKG 05/14/2023 at [facility] abnormal EKG showing artifact and numerous PACs  Repeat EKG in ED 05/14/2023 Sinus rhythm; Probable left atrial enlargement  Labs:     Component Value Date/Time   NA 142 05/14/2023 1102   NA 146 (H) 05/14/2023 0851   K 3.9 05/14/2023 1102   CL 108 05/14/2023 1102   CO2 24 05/14/2023 1102   GLUCOSE 80 05/14/2023 1102   GLUCOSE 70 05/14/2023 0851   BUN 14 05/14/2023 1102   BUN 12 05/14/2023 0851   CREATININE 0.81 05/14/2023 1102   CREATININE 0.91 04/26/2019 0932   CALCIUM 8.9 05/14/2023 1102   PROT 6.8 05/14/2023 0851   ALBUMIN 4.1 05/14/2023 0851   AST 20 05/14/2023 0851   ALT 15 05/14/2023 0851   ALKPHOS 112 05/14/2023 0851   BILITOT 0.3 05/14/2023 0851   GFRNONAA >60 05/14/2023 1102   GFRNONAA 72 04/26/2019 0932   GFRAA 83 04/26/2019 0932    Lab Results  Component Value Date   WBC 5.3 05/14/2023   HGB 12.2 05/14/2023   HCT 39.9 05/14/2023   MCV 88.7 05/14/2023   PLT 287 05/14/2023   Lab Results  Component Value Date   CHOL 189 05/14/2023   HDL 77 05/14/2023   LDLCALC 97 05/14/2023   TRIG 82 05/14/2023   CHOLHDL 2.1 04/26/2019   Lab Results  Component Value Date   HGBA1C 5.8 (H) 05/14/2023    Lab Results  Component Value Date   TSH 1.190 05/14/2023   Assessment & Plan:   Follow-up Encounter from ED for Abnormal EKG: presented to the ED after an abnormal EKG showing artifact and numerous PACs; BP that  day was 178/86. Repeat EKG in ED with Sinus rhythm; Probable left atrial enlargement. Blood Pressure: hypertensive today at 146/82, but improved to 138/80. Pt said that she has been doing alot lately: getting married in about a month, job is going reasonably well with some stress, and this recent ED visit frightened her - she has been reassured about this and will place referral for her to see Tessa Lerner for evaluation, whom she has seen in the past.   Sending in Wellbutrin XL 150 mg - take 1 tablet (150  mg total) by mouth daily, 0.25 mg Xanax - take 1 tablet (0.5 mg total) by mouth at bedtime as needed for anxiety.    Hx of Roux-en-Y Gastric Bypass; Obesity: recently started going to [facility]  Vitamin-D Deficiency managed with [facility] weekly  Discussed Alcohol consumption: she says that she drinks half of a large beer nightly, shares with her significant other. May be best to cut down for now.  Follow-up here in 3 months.    I,Emily Lagle,acting as a Neurosurgeon for [name], MD.,have documented all relevant documentation on the behalf of [name], MD,as directed by  [name], MD while in the presence of [name], MD.   I, [name], MD, have reviewed all documentation for this visit. The documentation on 06/08/23 for the exam, diagnosis, procedures, and orders are all accurate and complete.

## 2023-06-03 ENCOUNTER — Ambulatory Visit (INDEPENDENT_AMBULATORY_CARE_PROVIDER_SITE_OTHER): Payer: 59 | Admitting: Family Medicine

## 2023-06-03 ENCOUNTER — Encounter (INDEPENDENT_AMBULATORY_CARE_PROVIDER_SITE_OTHER): Payer: Self-pay | Admitting: Family Medicine

## 2023-06-03 VITALS — BP 127/79 | HR 77 | Temp 98.0°F | Ht 64.0 in | Wt 267.0 lb

## 2023-06-03 DIAGNOSIS — Z6841 Body Mass Index (BMI) 40.0 and over, adult: Secondary | ICD-10-CM

## 2023-06-03 DIAGNOSIS — E559 Vitamin D deficiency, unspecified: Secondary | ICD-10-CM

## 2023-06-03 DIAGNOSIS — R7303 Prediabetes: Secondary | ICD-10-CM | POA: Diagnosis not present

## 2023-06-03 NOTE — Assessment & Plan Note (Signed)

## 2023-06-03 NOTE — Assessment & Plan Note (Signed)
 Discussed importance of vitamin d supplementation.  Vitamin d supplementation has been shown to decrease fatigue, decrease risk of progression to insulin resistance and then prediabetes, decreases risk of falling in older age and can even assist in decreasing depressive symptoms in PTSD.   Patient told to start prescription strength Vitamin D that primary physician prescribed.

## 2023-06-03 NOTE — Progress Notes (Signed)
 SUBJECTIVE:  Chief Complaint: Obesity  Interim History: Patient is doing reasonably on her meal plan- trying to not stress out over her wedding which is coming up. She saw her PCP and mentioned she has had trouble sleeping and was given a prescription for xanax.  Patient reports no issues with the meal plan.  This week she did yogurt for the am but prior week she did an apple and cheese stick. She mentions overall she doesn't feel like she is eating much. She has used the food scale to weigh the meat portion of dinner. She has no upcoming plans in the next 2-3 weeks. Upcoming wedding on April 19th.  Dawna is here to discuss her progress with her obesity treatment plan. She is on the Category 1 Plan and states she is following her eating plan approximately 50 % of the time. She states she is not exercising.   OBJECTIVE: Visit Diagnoses: Problem List Items Addressed This Visit       Other   Vitamin D deficiency - Primary   Discussed importance of vitamin d supplementation.  Vitamin d supplementation has been shown to decrease fatigue, decrease risk of progression to insulin resistance and then prediabetes, decreases risk of falling in older age and can even assist in decreasing depressive symptoms in PTSD.   Patient told to start prescription strength Vitamin D that primary physician prescribed.        Prediabetes   Pathophysiology of progression through insulin resistance to prediabetes and diabetes was discussed at length today.  Patient to continue to monitor and be in control of total intake of snack calories which may be simple carbohydrates but should be consumed only after the patient has taken in all the nutrition for the day.  Macronutrient identification, classification and daily intake ratios were discussed.  Plan to repeat labs in 3 months to monitor both hemoglobin A1c and insulin levels.  No medications at this time as patient is not having significant hunger or cravings that  would make following meal plan more difficult.         Other Visit Diagnoses       Morbid obesity starting BMI of 46.35         BMI 45.0-49.9, adult (HCC)           Vitals Temp: 98 F (36.7 C) BP: 127/79 Pulse Rate: 77 SpO2: 98 %   Anthropometric Measurements Height: 5\' 4"  (1.626 m) Weight: 267 lb (121.1 kg) BMI (Calculated): 45.81 Weight at Last Visit: 270 lb Weight Lost Since Last Visit: 3 lb Weight Gained Since Last Visit: 0 Starting Weight: 270 lb Total Weight Loss (lbs): 3 lb (1.361 kg) Peak Weight: 332 lb   Body Composition  Body Fat %: 52.8 % Fat Mass (lbs): 141.2 lbs Muscle Mass (lbs): 120 lbs Total Body Water (lbs): 92.4 lbs Visceral Fat Rating : 18   Other Clinical Data Fasting: no Labs: no Today's Visit #: 2 Starting Date: 05/14/23     ASSESSMENT AND PLAN:  Diet: Lilliah is currently in the action stage of change. As such, her goal is to continue with weight loss efforts and has agreed to the Category 1 Plan.   Exercise:  All adults should avoid inactivity. Some activity is better than none, and adults who participate in any amount of physical activity, gain some health benefits.  Behavior Modification:  We discussed the following Behavioral Modification Strategies today: increasing lean protein intake, no skipping meals, meal planning and cooking strategies, keeping  healthy foods in the home, and planning for success.   Return in about 2 weeks (around 06/17/2023) for With [facility] and 2 weeks later with Dr. Marquis Lunch.. She was informed of the importance of frequent follow up visits to maximize her success with intensive lifestyle modifications for her multiple health conditions.  Attestation Statements:   Reviewed by clinician on day of visit: allergies, medications, problem list, medical history, surgical history, family history, social history, and previous encounter notes.   Time spent on visit including pre-visit chart review and post-visit care and  charting was 30 minutes  Reuben Likes, MD

## 2023-06-08 NOTE — Patient Instructions (Addendum)
 Patient has a lot going on with work and wedding planning. [facility]. She will be referred to Dr. Odis Hollingshead for evaluation. Was frightened by ED visit. I will see her in 3 months.

## 2023-06-17 ENCOUNTER — Ambulatory Visit (INDEPENDENT_AMBULATORY_CARE_PROVIDER_SITE_OTHER): Admitting: Family Medicine

## 2023-06-18 ENCOUNTER — Ambulatory Visit: Payer: 59 | Admitting: Orthopaedic Surgery

## 2023-06-18 ENCOUNTER — Encounter: Payer: Self-pay | Admitting: Orthopaedic Surgery

## 2023-06-18 VITALS — Ht 64.0 in | Wt 274.0 lb

## 2023-06-18 DIAGNOSIS — M25561 Pain in right knee: Secondary | ICD-10-CM

## 2023-06-18 DIAGNOSIS — M1711 Unilateral primary osteoarthritis, right knee: Secondary | ICD-10-CM

## 2023-06-18 DIAGNOSIS — M25562 Pain in left knee: Secondary | ICD-10-CM | POA: Diagnosis not present

## 2023-06-18 DIAGNOSIS — G8929 Other chronic pain: Secondary | ICD-10-CM

## 2023-06-18 DIAGNOSIS — M1712 Unilateral primary osteoarthritis, left knee: Secondary | ICD-10-CM | POA: Diagnosis not present

## 2023-06-18 NOTE — Progress Notes (Addendum)
 The patient is very well-known to me.  She unfortunately has severe debilitating arthritis of both her knees this been well-documented with clinical exam and x-ray findings.  Both knees have significant valgus malalignment.  They hurt on a daily basis and she is in need of knee replacement surgery.  She is only 58 years old.  She is a patient of Neomi Banks.  The last time we weighed her her BMI was 47.  On today's visit her BMI is still 47.  Examination of both knees again shows significant valgus malalignment with pain throughout the arc of motion of her knees.  Most of her obesity though is truncal and not in her knees.  We still need to document some weight loss before we schedule her surgery.  We need to look to see if her insurance requires her BMI to be below 40 before we can proceed with surgery.  I would be comfortable proceeding with surgery with her BMI in the lower 40s based on her clinical exam and her x-ray findings since she does not have very large knees.  However she understands this may be an insurance issue as well.  Also explained that certainly higher BMI's have a high complication rate with implant failure.  She understands this as well.  Will see her back in 6 weeks with a repeat weight and BMI calculation.  We did find out that her insurance is not a part of the [facility] bundle program and there is not a weight limitation as a relates to scheduling joint replacement surgery.  We would like her to lose some weight because that would be the safest for scheduling surgery but could we examine her at the next visit, we will make a informed clinical decision as to proceeding with a knee replacement.  We still would like an annual weight and BMI calculation at her next visit.

## 2023-07-15 ENCOUNTER — Ambulatory Visit (INDEPENDENT_AMBULATORY_CARE_PROVIDER_SITE_OTHER): Admitting: Adult Health

## 2023-07-29 ENCOUNTER — Ambulatory Visit (INDEPENDENT_AMBULATORY_CARE_PROVIDER_SITE_OTHER): Admitting: Family Medicine

## 2023-07-29 VITALS — BP 121/81 | HR 82 | Temp 98.3°F | Ht 64.0 in | Wt 272.0 lb

## 2023-07-29 DIAGNOSIS — Z6841 Body Mass Index (BMI) 40.0 and over, adult: Secondary | ICD-10-CM

## 2023-07-29 DIAGNOSIS — R7303 Prediabetes: Secondary | ICD-10-CM

## 2023-07-29 NOTE — Progress Notes (Unsigned)
   SUBJECTIVE:  Chief Complaint: Obesity  Interim History: Patient got married April 19th and mentions this was very stressful especially financially.  Since last appointment and after the wedding she has been trying to get her finances in check.  She wants to drink more water daily.  She is on her food daily and mentions her feet hurt.   Emma Gonzales is here to discuss her progress with her obesity treatment plan. She is on the Category 1 Plan and states she is following her eating plan approximately 25 % of the time. She states she is not exercising.  OBJECTIVE: Visit Diagnoses: Problem List Items Addressed This Visit   None   Vitals Temp: 98.3 F (36.8 C) BP: 121/81 Pulse Rate: 82 SpO2: 99 %   Anthropometric Measurements Height: 5\' 4"  (1.626 m) Weight: 272 lb (123.4 kg) BMI (Calculated): 46.67 Weight at Last Visit: 267 lb Weight Lost Since Last Visit: 0 Weight Gained Since Last Visit: 5 Starting Weight: 270 lb Total Weight Loss (lbs): 0 lb (0 kg) Peak Weight: 332 lb   Body Composition  Body Fat %: 54.9 % Fat Mass (lbs): 149.4 lbs Muscle Mass (lbs): 116.4 lbs Visceral Fat Rating : 19   Other Clinical Data Today's Visit #: 3 Starting Date: 05/14/23 Comments: Cat 1     ASSESSMENT AND PLAN:  Diet: Emma Gonzales is currently in the action stage of change. As such, her goal is to continue with weight loss efforts and has agreed to the Category 1 Plan.  She has been trying to focus on getting more continued nutrition in like cottage cheese, tuna, [place] burger.  She may opt to create a food journal and would be at the goal of 1000 calories and 75 or more grams of protein.  Patient to start food log or journaling meal plan.  The initial goal will be to habitually log or journal for at least 4 days a week.  The expectation it that patient may not initially meet calorie or protein goals as the nutritional understanding of food intake is begun.  We discussed the 10:1 ratio when reading a  food label.  Patient agrees to keep a food log either electronically or on paper and bring to the next appointment to be able to dissect and discuss it with provider.    Exercise:  All adults should avoid inactivity. Some activity is better than none, and adults who participate in any amount of physical activity, gain some health benefits.  Behavior Modification:  We discussed the following Behavioral Modification Strategies today: increasing lean protein intake, decreasing simple carbohydrates, increasing vegetables, increase H2O intake, and no skipping meals.   Return in about 4 weeks (around 08/26/2023).   She was informed of the importance of frequent follow up visits to maximize her success with intensive lifestyle modifications for her multiple health conditions.  Attestation Statements:   Reviewed by clinician on day of visit: allergies, medications, problem list, medical history, surgical history, family history, social history, and previous encounter notes.     Donaciano Frizzle, MD

## 2023-07-30 ENCOUNTER — Ambulatory Visit: Admitting: Orthopaedic Surgery

## 2023-08-04 NOTE — Assessment & Plan Note (Signed)
 Anthropometric Measurements Height: 5\' 4"  (1.626 m) Weight: 272 lb (123.4 kg) BMI (Calculated): 46.67 Weight at Last Visit: 267 lb Weight Lost Since Last Visit: 0 Weight Gained Since Last Visit: 5 Starting Weight: 270 lb Total Weight Loss (lbs): 0 lb (0 kg) Peak Weight: 332 lb Body Composition  Body Fat %: 54.9 % Fat Mass (lbs): 149.4 lbs Muscle Mass (lbs): 116.4 lbs Visceral Fat Rating : 19 Other Clinical Data Today's Visit #: 3 Starting Date: 05/14/23 Comments: Cat 1

## 2023-08-04 NOTE — Assessment & Plan Note (Signed)
 Patient is working on limiting simple carbohydrate intake and increasing protein quantity intake daily.  Follow up on intake at next appointment- may want to discuss pharmacologic treatment at that time.

## 2023-08-21 NOTE — Progress Notes (Shared)
 Patient Care Team: Sylvan Evener, MD as PCP - General (Internal Medicine) Himmelrich, Jesslyn Moro, RD (Inactive) as Dietitian  Visit Date: 08/21/23  Subjective:  Patient Emma Gonzales,Female DOB:15-May-1965,57 y.o. BJY:[id number]   58 y.o.Female presents today for 3 months follow-up for PACs. Patient has a past medical history of Vitamin-D Deficiency; Obesity; S/p Roux-en-Y Gastric Bypass. Was seen in ED on 3/05 following an abnormal EKG reading showing artifacts and numerous PACs with BP that day being 178/68. Subsequently seen in this office 3/13 where blood pressure was initially 146/82, normalized to 138/80. At the time she was dealing with some situational stress, so was started on Xanax  0.5 mg as needed at bedtime and Wellbutrin -XL 150 mg daily. Was referred to Dr. Olinda Bertrand, Cardiologist, who she last saw in 2022.   History of Obesity; BMI 40+; S/p Roux-en-Y Gastric Bypass 2012 has recently started going to [facility]. Since being seen in March her weight has gone from 267 lbs BMI 45.83 to 272 lbs46.69 on 07/29/2023.  History of Vitamin-D Deficiency treated with Vitamin-D 50,000 units weekly.  Past Medical History:  Diagnosis Date   Alcohol abuse    Anxiety attack 01/24/2011   previoulsy and only once   B12 deficiency    Depression    GERD (gastroesophageal reflux disease) 01/24/2011   reflux with hiatal hernia-no regular meds   Hiatal hernia 01/24/2011   hx. of this recent dx./no nerve issues   Leg pain    Obesity    PONV (postoperative nausea and vomiting) 01/24/2011   pt. has PONV with anesthesia occ.   Rheumatoid arthritis (HCC)    Sleep apnea 01/24/2011   no cpap used   Varicose vein of leg    BILATERAL   Vitamin D  deficiency     Allergies  Allergen Reactions   Amoxicillin Other (See Comments)    Severe yeast infection Has patient had a PCN reaction causing immediate rash, facial/tongue/throat swelling, SOB or lightheadedness with hypotension:  no Has patient had a PCN reaction causing severe rash involving mucus membranes or skin necrosis: No Has patient had a PCN reaction that required hospitalization: No Has patient had a PCN reaction occurring within the last 10 years: Yes If all of the above answers are NO, then may proceed with Cephalosporin use.     Past Surgical History:  Procedure Laterality Date   CHOLECYSTECTOMY  01-24-11   '96-Laparoscopic   Cotton Osteotomy w/ Bone Graft Right 06/16/2015   ENDOMETRIAL ABLATION W/ NOVASURE     FOOT SURGERY  01-24-11   bilateral   GASTRIC ROUX-EN-Y  01/28/2011   Procedure: LAPAROSCOPIC ROUX-EN-Y GASTRIC;  Surgeon: Azucena Bollard, MD;  Location: [facility];  Service: General;  Laterality: N/A;  With upper endoscopy   ORIF METATARSAL FRACTURE Right 07/14/2015   TUBAL LIGATION     UMBILICAL HERNIA REPAIR N/A 12/23/2012   Procedure: LAPAROSCOPY  REPAIR  UMBILICAL HERNIA, CLOSURE OF INTERNAL HERNIA DEFECT;  Surgeon: Azucena Bollard, MD;  Location: [facility];  Service: General;  Laterality: N/A;   Family History  Problem Relation Age of Onset   Diabetes Mother    Heart disease Mother        Not sure what type of heart disease   Heart disease Father    Hypertension Father    Hyperlipidemia Father    Heart attack Father    Sleep apnea Father    Diabetes Brother    Social History   Social History Narrative  Not on file   ROS   Objective:  Vitals: There were no vitals taken for this visit.  Physical Exam  Results:  Studies Obtained And Personally Reviewed By Me:  {Imaging; Colonoscopy, Mammogram; DEXA; Echocardiogram; Heart Cath; Stress Test; CT Coronary Calcium Scoring; etc:32292}  Labs:     Component Value Date/Time   NA 142 05/14/2023 1102   NA 146 (H) 05/14/2023 0851   K 3.9 05/14/2023 1102   CL 108 05/14/2023 1102   CO2 24 05/14/2023 1102   GLUCOSE 80 05/14/2023 1102   GLUCOSE 70 05/14/2023 0851   BUN 14 05/14/2023 1102   BUN 12 05/14/2023 0851   CREATININE  0.81 05/14/2023 1102   CREATININE 0.91 04/26/2019 0932   CALCIUM 8.9 05/14/2023 1102   PROT 6.8 05/14/2023 0851   ALBUMIN 4.1 05/14/2023 0851   AST 20 05/14/2023 0851   ALT 15 05/14/2023 0851   ALKPHOS 112 05/14/2023 0851   BILITOT 0.3 05/14/2023 0851   GFRNONAA >60 05/14/2023 1102   GFRNONAA 72 04/26/2019 0932   GFRAA 83 04/26/2019 0932    Lab Results  Component Value Date   WBC 5.3 05/14/2023   HGB 12.2 05/14/2023   HCT 39.9 05/14/2023   MCV 88.7 05/14/2023   PLT 287 05/14/2023   Lab Results  Component Value Date   CHOL 189 05/14/2023   HDL 77 05/14/2023   LDLCALC 97 05/14/2023   TRIG 82 05/14/2023   CHOLHDL 2.1 04/26/2019   Lab Results  Component Value Date   HGBA1C 5.8 (H) 05/14/2023    Lab Results  Component Value Date   TSH 1.190 05/14/2023    {PSA (Optional):32132} No results found for any visits on 08/22/23. Assessment & Plan:   ***        I,Emily Lagle,acting as a scribe for Sylvan Evener, MD.,have documented all relevant documentation on the behalf of Sylvan Evener, MD,as directed by  Sylvan Evener, MD while in the presence of Sylvan Evener, MD.   ***

## 2023-08-22 ENCOUNTER — Ambulatory Visit (INDEPENDENT_AMBULATORY_CARE_PROVIDER_SITE_OTHER): Admitting: Internal Medicine

## 2023-08-22 ENCOUNTER — Encounter: Payer: Self-pay | Admitting: Internal Medicine

## 2023-08-22 VITALS — BP 120/68 | HR 106 | Temp 98.2°F | Ht 64.0 in | Wt 278.4 lb

## 2023-08-22 DIAGNOSIS — I491 Atrial premature depolarization: Secondary | ICD-10-CM | POA: Diagnosis not present

## 2023-08-22 DIAGNOSIS — E559 Vitamin D deficiency, unspecified: Secondary | ICD-10-CM | POA: Diagnosis not present

## 2023-08-22 DIAGNOSIS — F439 Reaction to severe stress, unspecified: Secondary | ICD-10-CM

## 2023-08-22 DIAGNOSIS — Z9884 Bariatric surgery status: Secondary | ICD-10-CM

## 2023-08-22 DIAGNOSIS — R7303 Prediabetes: Secondary | ICD-10-CM | POA: Diagnosis not present

## 2023-08-22 DIAGNOSIS — M1712 Unilateral primary osteoarthritis, left knee: Secondary | ICD-10-CM

## 2023-08-22 DIAGNOSIS — K219 Gastro-esophageal reflux disease without esophagitis: Secondary | ICD-10-CM

## 2023-08-22 MED ORDER — ERGOCALCIFEROL 1.25 MG (50000 UT) PO CAPS: 50000.0000 [IU] | ORAL_CAPSULE | ORAL | 3 refills | Status: AC

## 2023-08-22 NOTE — Patient Instructions (Addendum)
 Will make referral to [facility]. Patient would like to change from Dr. Michele. Hx PACs.

## 2023-09-07 ENCOUNTER — Encounter: Payer: Self-pay | Admitting: Internal Medicine

## 2023-09-08 ENCOUNTER — Ambulatory Visit (INDEPENDENT_AMBULATORY_CARE_PROVIDER_SITE_OTHER): Admitting: Family Medicine

## 2023-10-04 ENCOUNTER — Other Ambulatory Visit: Payer: Self-pay | Admitting: Orthopaedic Surgery

## 2023-10-07 ENCOUNTER — Encounter: Payer: Self-pay | Admitting: Podiatry

## 2023-10-07 ENCOUNTER — Ambulatory Visit (INDEPENDENT_AMBULATORY_CARE_PROVIDER_SITE_OTHER): Admitting: Podiatry

## 2023-10-07 DIAGNOSIS — R7303 Prediabetes: Secondary | ICD-10-CM

## 2023-10-07 DIAGNOSIS — M79671 Pain in right foot: Secondary | ICD-10-CM | POA: Diagnosis not present

## 2023-10-07 DIAGNOSIS — Q828 Other specified congenital malformations of skin: Secondary | ICD-10-CM | POA: Diagnosis not present

## 2023-10-07 DIAGNOSIS — M79672 Pain in left foot: Secondary | ICD-10-CM

## 2023-10-09 NOTE — Progress Notes (Signed)
  Subjective:  Patient ID: Emma Gonzales, female    DOB: 1965/04/25,  MRN: [id number]  Emma Gonzales presents to clinic today for painful porokeratotic lesions of both feet. Pain prevent(s) comfortable ambulation. Aggravating factor is weightbearing with and without shoegear.  Chief Complaint  Patient presents with   Callouses    Rm16 bilateral callouses/not diabetic    New problem(s): None.   PCP is Perri Ronal PARAS, MD. Emma Gonzales 08/22/2023.  Allergies  Allergen Reactions   Amoxicillin Other (See Comments)    Severe yeast infection Has patient had a PCN reaction causing immediate rash, facial/tongue/throat swelling, SOB or lightheadedness with hypotension: no Has patient had a PCN reaction causing severe rash involving mucus membranes or skin necrosis: No Has patient had a PCN reaction that required hospitalization: No Has patient had a PCN reaction occurring within the last 10 years: Yes If all of the above answers are NO, then may proceed with Cephalosporin use.     Review of Systems: Negative except as noted in the HPI.  Objective: No changes noted in today's physical examination. There were no vitals filed for this visit. Emma Gonzales is a pleasant 58 y.o. female morbidly obese in NAD. AAO x 3.  Vascular Examination: Capillary refill time immediate b/l. Palpable pedal pulses. Pedal hair present b/l No pedal edema.. No pain with calf compression b/l. Skin temperature gradient WNL b/l. No cyanosis or clubbing b/l. No ischemia or gangrene noted b/l.   Neurological Examination: Sensation grossly intact b/l with 10 gram monofilament. Vibratory sensation intact b/l.   Dermatological Examination: Pedal skin with normal turgor, texture and tone b/l.  No open wounds. No interdigital macerations.   Porokeratotic lesion(s) submet head 3 right foot, submet head 4 left foot, and left plantar midfoot. No erythema, no edema, no drainage, no fluctuance.  Musculoskeletal:  Normal muscle  strength 5/5 to all lower extremity muscle groups bilaterally. No pain crepitus or joint limitation noted with ROM b/l lower extremities. Hammertoe(s) noted to the 2-5 bilaterally.  Last A1c:      Latest Ref Rng & Units 05/14/2023    8:51 AM  Hemoglobin A1C  Hemoglobin-A1c 4.8 - 5.6 % 5.8    Assessment/Plan: 1. Porokeratosis   2. Pain in both feet   3. Prediabetes    -Consent given for treatment as described below: -Examined patient. -Porokeratotic lesion(s) submet head 3 right foot, submet head 4 left foot, and plantarlateral aspect of midfoot left foot pared and enucleated with sterile currette without incident. Total number of lesions debrided=3. -Patient/POA to call should there be question/concern in the interim.   Return in about 3 months (around 01/07/2024).  Emma Gonzales, DPM      [place] LOCATION: [address] [place] [address]                   Office [phone]   [facility] LOCATION: [address] [place], [place] [address] Office [phone]

## 2024-01-20 ENCOUNTER — Ambulatory Visit: Admitting: Podiatry

## 2024-01-20 ENCOUNTER — Encounter: Payer: Self-pay | Admitting: Podiatry

## 2024-01-20 DIAGNOSIS — Q828 Other specified congenital malformations of skin: Secondary | ICD-10-CM

## 2024-01-20 DIAGNOSIS — M79672 Pain in left foot: Secondary | ICD-10-CM | POA: Diagnosis not present

## 2024-01-20 DIAGNOSIS — M79671 Pain in right foot: Secondary | ICD-10-CM | POA: Diagnosis not present

## 2024-01-20 DIAGNOSIS — R7303 Prediabetes: Secondary | ICD-10-CM | POA: Diagnosis not present

## 2024-01-30 NOTE — Progress Notes (Signed)
  Subjective:  Patient ID: Emma Gonzales, female    DOB: October 29, 1965,  MRN: [id number]  Emma Gonzales presents to clinic today for painful porokeratotic lesions b/l lower extremities. Pain prevent(s) comfortable ambulation. Aggravating factor is weightbearing with and without shoegear.  Patient is prediabetic. Chief Complaint  Patient presents with   Callouses    Painful callus lesions, 3 month follow up   New problem(s): None.   PCP is Baxley, Ronal PARAS, MD.  Allergies  Allergen Reactions   Amoxicillin Other (See Comments)    Severe yeast infection Has patient had a PCN reaction causing immediate rash, facial/tongue/throat swelling, SOB or lightheadedness with hypotension: no Has patient had a PCN reaction causing severe rash involving mucus membranes or skin necrosis: No Has patient had a PCN reaction that required hospitalization: No Has patient had a PCN reaction occurring within the last 10 years: Yes If all of the above answers are NO, then may proceed with Cephalosporin use.     Review of Systems: Negative except as noted in the HPI.  Objective: No changes noted in today's physical examination. There were no vitals filed for this visit. Emma Gonzales is a pleasant 58 y.o. female morbidly obese in NAD. AAO x 3.  Vascular Examination: Capillary refill time immediate b/l. Palpable pedal pulses. Pedal hair present b/l No pedal edema.. No pain with calf compression b/l. Skin temperature gradient WNL b/l. No cyanosis or clubbing b/l. No ischemia or gangrene noted b/l.   Neurological Examination: Sensation grossly intact b/l with 10 gram monofilament. Vibratory sensation intact b/l.   Dermatological Examination: Pedal skin with normal turgor, texture and tone b/l.  No open wounds. No interdigital macerations.   Porokeratotic lesion(s) submet head 3 right foot, submet head 4 left foot, and left plantar midfoot. No erythema, no edema, no drainage, no  fluctuance.  Musculoskeletal:  Normal muscle strength 5/5 to all lower extremity muscle groups bilaterally. No pain crepitus or joint limitation noted with ROM b/l lower extremities. Hammertoe(s) noted to the 2-5 bilaterally.  Assessment/Plan: 1. Porokeratosis   2. Pain in both feet   3. Prediabetes   -Patient was evaluated today. All questions/concerns addressed on today's visit. -Patient to continue soft, supportive shoe gear daily. -Porokeratotic lesion(s) submet head 3 right foot, submet head 4 left foot, and plantarlateral aspect of midfoot left foot pared and enucleated with sterile currette without incident. Total number of lesions debrided=3. -Salinocaine applied to lesions and patient to wash off with soap and water in six hours. -Patient/POA to call should there be question/concern in the interim.   Return in about 3 months (around 04/21/2024).  Emma Gonzales, DPM      [place] LOCATION: [address], [place] [address]                   Office [phone]   [facility] LOCATION: [address] [place], [place] [address] Office [phone]

## 2024-05-05 ENCOUNTER — Ambulatory Visit: Admitting: Podiatry
